# Patient Record
Sex: Female | Born: 1935 | Race: White | Hispanic: No | Smoking: Never smoker
Health system: Southern US, Community
[De-identification: ages and names within clinical notes are randomized; demographics above are authoritative.]

## PROBLEM LIST (undated history)

## (undated) DIAGNOSIS — K219 Gastro-esophageal reflux disease without esophagitis: Secondary | ICD-10-CM

## (undated) DIAGNOSIS — H3530 Unspecified macular degeneration: Secondary | ICD-10-CM

## (undated) DIAGNOSIS — F0390 Unspecified dementia without behavioral disturbance: Secondary | ICD-10-CM

## (undated) DIAGNOSIS — I350 Nonrheumatic aortic (valve) stenosis: Secondary | ICD-10-CM

## (undated) DIAGNOSIS — E039 Hypothyroidism, unspecified: Secondary | ICD-10-CM

## (undated) HISTORY — PX: ABDOMINAL HYSTERECTOMY: SHX81

## (undated) HISTORY — PX: CHOLECYSTECTOMY: SHX55

## (undated) HISTORY — PX: EYE SURGERY: SHX253

---

## 2014-07-11 DIAGNOSIS — R350 Frequency of micturition: Secondary | ICD-10-CM | POA: Diagnosis not present

## 2014-08-23 DIAGNOSIS — M25512 Pain in left shoulder: Secondary | ICD-10-CM | POA: Diagnosis not present

## 2014-08-23 DIAGNOSIS — R21 Rash and other nonspecific skin eruption: Secondary | ICD-10-CM | POA: Diagnosis not present

## 2014-10-12 DIAGNOSIS — H43813 Vitreous degeneration, bilateral: Secondary | ICD-10-CM | POA: Diagnosis not present

## 2014-10-12 DIAGNOSIS — H35051 Retinal neovascularization, unspecified, right eye: Secondary | ICD-10-CM | POA: Diagnosis not present

## 2014-10-12 DIAGNOSIS — H3532 Exudative age-related macular degeneration: Secondary | ICD-10-CM | POA: Diagnosis not present

## 2014-10-22 DIAGNOSIS — E039 Hypothyroidism, unspecified: Secondary | ICD-10-CM | POA: Diagnosis not present

## 2014-10-22 DIAGNOSIS — L989 Disorder of the skin and subcutaneous tissue, unspecified: Secondary | ICD-10-CM | POA: Diagnosis not present

## 2014-10-22 DIAGNOSIS — Z0000 Encounter for general adult medical examination without abnormal findings: Secondary | ICD-10-CM | POA: Diagnosis not present

## 2014-10-22 DIAGNOSIS — Z1389 Encounter for screening for other disorder: Secondary | ICD-10-CM | POA: Diagnosis not present

## 2014-10-22 DIAGNOSIS — F5101 Primary insomnia: Secondary | ICD-10-CM | POA: Diagnosis not present

## 2014-10-22 DIAGNOSIS — I10 Essential (primary) hypertension: Secondary | ICD-10-CM | POA: Diagnosis not present

## 2014-10-22 DIAGNOSIS — H5712 Ocular pain, left eye: Secondary | ICD-10-CM | POA: Diagnosis not present

## 2014-10-22 DIAGNOSIS — N183 Chronic kidney disease, stage 3 (moderate): Secondary | ICD-10-CM | POA: Diagnosis not present

## 2014-10-25 DIAGNOSIS — R4182 Altered mental status, unspecified: Secondary | ICD-10-CM | POA: Diagnosis not present

## 2014-11-02 DIAGNOSIS — X32XXXA Exposure to sunlight, initial encounter: Secondary | ICD-10-CM | POA: Diagnosis not present

## 2014-11-02 DIAGNOSIS — L309 Dermatitis, unspecified: Secondary | ICD-10-CM | POA: Diagnosis not present

## 2014-11-02 DIAGNOSIS — L570 Actinic keratosis: Secondary | ICD-10-CM | POA: Diagnosis not present

## 2014-11-06 DIAGNOSIS — H01023 Squamous blepharitis right eye, unspecified eyelid: Secondary | ICD-10-CM | POA: Diagnosis not present

## 2014-11-06 DIAGNOSIS — H5712 Ocular pain, left eye: Secondary | ICD-10-CM | POA: Diagnosis not present

## 2015-01-01 DIAGNOSIS — H52223 Regular astigmatism, bilateral: Secondary | ICD-10-CM | POA: Diagnosis not present

## 2015-01-01 DIAGNOSIS — H01001 Unspecified blepharitis right upper eyelid: Secondary | ICD-10-CM | POA: Diagnosis not present

## 2015-01-01 DIAGNOSIS — H5712 Ocular pain, left eye: Secondary | ICD-10-CM | POA: Diagnosis not present

## 2015-01-01 DIAGNOSIS — H5203 Hypermetropia, bilateral: Secondary | ICD-10-CM | POA: Diagnosis not present

## 2015-01-01 DIAGNOSIS — H01004 Unspecified blepharitis left upper eyelid: Secondary | ICD-10-CM | POA: Diagnosis not present

## 2015-01-01 DIAGNOSIS — H01005 Unspecified blepharitis left lower eyelid: Secondary | ICD-10-CM | POA: Diagnosis not present

## 2015-01-01 DIAGNOSIS — H01002 Unspecified blepharitis right lower eyelid: Secondary | ICD-10-CM | POA: Diagnosis not present

## 2015-01-01 DIAGNOSIS — H3532 Exudative age-related macular degeneration: Secondary | ICD-10-CM | POA: Diagnosis not present

## 2015-03-02 DIAGNOSIS — R509 Fever, unspecified: Secondary | ICD-10-CM | POA: Diagnosis not present

## 2015-03-02 DIAGNOSIS — R1084 Generalized abdominal pain: Secondary | ICD-10-CM | POA: Diagnosis not present

## 2015-03-11 ENCOUNTER — Ambulatory Visit
Admission: RE | Admit: 2015-03-11 | Discharge: 2015-03-11 | Disposition: A | Discharge status: Home / Self Care | Payer: Commercial Managed Care - HMO | Source: Ambulatory Visit | Attending: Internal Medicine | Admitting: Internal Medicine

## 2015-03-11 ENCOUNTER — Other Ambulatory Visit: Payer: Self-pay | Admitting: Internal Medicine

## 2015-03-11 DIAGNOSIS — R05 Cough: Secondary | ICD-10-CM

## 2015-03-11 DIAGNOSIS — R059 Cough, unspecified: Secondary | ICD-10-CM

## 2015-03-11 DIAGNOSIS — Z87440 Personal history of urinary (tract) infections: Secondary | ICD-10-CM | POA: Diagnosis not present

## 2015-03-11 DIAGNOSIS — G4700 Insomnia, unspecified: Secondary | ICD-10-CM | POA: Diagnosis not present

## 2015-04-23 DIAGNOSIS — N183 Chronic kidney disease, stage 3 (moderate): Secondary | ICD-10-CM | POA: Diagnosis not present

## 2015-04-23 DIAGNOSIS — I10 Essential (primary) hypertension: Secondary | ICD-10-CM | POA: Diagnosis not present

## 2015-04-23 DIAGNOSIS — E039 Hypothyroidism, unspecified: Secondary | ICD-10-CM | POA: Diagnosis not present

## 2015-04-23 DIAGNOSIS — G4700 Insomnia, unspecified: Secondary | ICD-10-CM | POA: Diagnosis not present

## 2015-04-23 DIAGNOSIS — Z23 Encounter for immunization: Secondary | ICD-10-CM | POA: Diagnosis not present

## 2015-04-29 DIAGNOSIS — H353124 Nonexudative age-related macular degeneration, left eye, advanced atrophic with subfoveal involvement: Secondary | ICD-10-CM | POA: Diagnosis not present

## 2015-04-29 DIAGNOSIS — H43813 Vitreous degeneration, bilateral: Secondary | ICD-10-CM | POA: Diagnosis not present

## 2015-04-29 DIAGNOSIS — H353212 Exudative age-related macular degeneration, right eye, with inactive choroidal neovascularization: Secondary | ICD-10-CM | POA: Diagnosis not present

## 2015-07-10 DIAGNOSIS — N390 Urinary tract infection, site not specified: Secondary | ICD-10-CM | POA: Diagnosis not present

## 2015-07-16 DIAGNOSIS — N390 Urinary tract infection, site not specified: Secondary | ICD-10-CM | POA: Diagnosis not present

## 2015-07-16 DIAGNOSIS — R4182 Altered mental status, unspecified: Secondary | ICD-10-CM | POA: Diagnosis not present

## 2015-07-16 DIAGNOSIS — R413 Other amnesia: Secondary | ICD-10-CM | POA: Diagnosis not present

## 2015-07-24 ENCOUNTER — Encounter (HOSPITAL_COMMUNITY): Payer: Self-pay | Admitting: Emergency Medicine

## 2015-07-24 ENCOUNTER — Emergency Department (HOSPITAL_COMMUNITY)
Admission: EM | Admit: 2015-07-24 | Discharge: 2015-07-24 | Disposition: A | Discharge status: Home / Self Care | Payer: Commercial Managed Care - HMO | Attending: Emergency Medicine | Admitting: Emergency Medicine

## 2015-07-24 ENCOUNTER — Emergency Department (HOSPITAL_COMMUNITY): Payer: Commercial Managed Care - HMO

## 2015-07-24 DIAGNOSIS — Z87440 Personal history of urinary (tract) infections: Secondary | ICD-10-CM | POA: Diagnosis not present

## 2015-07-24 DIAGNOSIS — K449 Diaphragmatic hernia without obstruction or gangrene: Secondary | ICD-10-CM | POA: Diagnosis not present

## 2015-07-24 DIAGNOSIS — Z79899 Other long term (current) drug therapy: Secondary | ICD-10-CM | POA: Insufficient documentation

## 2015-07-24 DIAGNOSIS — R410 Disorientation, unspecified: Secondary | ICD-10-CM | POA: Diagnosis not present

## 2015-07-24 DIAGNOSIS — Z8669 Personal history of other diseases of the nervous system and sense organs: Secondary | ICD-10-CM | POA: Diagnosis not present

## 2015-07-24 DIAGNOSIS — R4182 Altered mental status, unspecified: Secondary | ICD-10-CM | POA: Diagnosis present

## 2015-07-24 HISTORY — DX: Unspecified macular degeneration: H35.30

## 2015-07-24 LAB — CBC
HCT: 38.8 % (ref 36.0–46.0)
Hemoglobin: 13.6 g/dL (ref 12.0–15.0)
MCH: 31 pg (ref 26.0–34.0)
MCHC: 35.1 g/dL (ref 30.0–36.0)
MCV: 88.4 fL (ref 78.0–100.0)
Platelets: 241 10*3/uL (ref 150–400)
RBC: 4.39 MIL/uL (ref 3.87–5.11)
RDW: 13 % (ref 11.5–15.5)
WBC: 7.9 10*3/uL (ref 4.0–10.5)

## 2015-07-24 LAB — COMPREHENSIVE METABOLIC PANEL
ALT: 13 U/L — ABNORMAL LOW (ref 14–54)
AST: 26 U/L (ref 15–41)
Albumin: 3.8 g/dL (ref 3.5–5.0)
Alkaline Phosphatase: 50 U/L (ref 38–126)
Anion gap: 12 (ref 5–15)
BUN: 9 mg/dL (ref 6–20)
CO2: 20 mmol/L — ABNORMAL LOW (ref 22–32)
Calcium: 9.9 mg/dL (ref 8.9–10.3)
Chloride: 99 mmol/L — ABNORMAL LOW (ref 101–111)
Creatinine, Ser: 1.13 mg/dL — ABNORMAL HIGH (ref 0.44–1.00)
GFR calc Af Amer: 47 mL/min — ABNORMAL LOW (ref >60)
GFR calc non Af Amer: 40 mL/min — ABNORMAL LOW (ref >60)
Glucose, Bld: 223 mg/dL — ABNORMAL HIGH (ref 65–99)
Potassium: 3.7 mmol/L (ref 3.5–5.1)
Sodium: 131 mmol/L — ABNORMAL LOW (ref 135–145)
Total Bilirubin: 1 mg/dL (ref 0.3–1.2)
Total Protein: 7.1 g/dL (ref 6.5–8.1)

## 2015-07-24 LAB — URINALYSIS, ROUTINE W REFLEX MICROSCOPIC
Bilirubin Urine: NEGATIVE
Glucose, UA: 100 mg/dL — AB
Ketones, ur: NEGATIVE mg/dL
Leukocytes, UA: NEGATIVE
Nitrite: NEGATIVE
Protein, ur: NEGATIVE mg/dL
Specific Gravity, Urine: 1.005 (ref 1.005–1.030)
pH: 7 (ref 5.0–8.0)

## 2015-07-24 LAB — URINE MICROSCOPIC-ADD ON
Bacteria, UA: NONE SEEN
WBC, UA: NONE SEEN WBC/hpf (ref 0–5)

## 2015-07-24 MED ORDER — IOHEXOL 300 MG/ML  SOLN
75.0000 mL | Freq: Once | INTRAMUSCULAR | Status: AC | PRN
  Administered 2015-07-24: 75 mL via INTRAVENOUS

## 2015-07-24 NOTE — ED Provider Notes (Signed)
CSN: [id number]     Arrival date & time 07/24/15  1242 History   First MD Initiated Contact with Patient 07/24/15 1638     Chief Complaint  Patient presents with  . Recurrent UTI  . Altered Mental Status     (Consider location/radiation/quality/duration/timing/severity/associated sxs/prior Treatment) HPI   82yF with change in mental status. Onset about a week ago but worse in last few days. Recently tx'd for UTI, most recently cipro which just stopped. Has been having hallucinations such as seeing elephants in yard.Somewhat paranoid. Sleeping more erratic. Has not voiced any specific complaints. No recent falls/trauma. No fever. Appetite ok. No recent med changes aside from abx.   Past Medical History  Diagnosis Date  . Macular degeneration bilateral   Past Surgical History  Procedure Laterality Date  . Cholecystectomy    . Eye surgery     No family history on file. Social History  Substance Use Topics  . Smoking status: Never Smoker   . Smokeless tobacco: None  . Alcohol Use: No   OB History    No data available     Review of Systems  All systems reviewed and negative, other than as noted in HPI.   Allergies  Review of patient's allergies indicates no known allergies.  Home Medications   Prior to Admission medications   Medication Sig Start Date End Date Taking? Authorizing Provider  ALPRAZolam Duanne Moron) 1 MG tablet Take 1 mg by mouth at bedtime as needed for anxiety.   Yes Historical Provider, MD  ciprofloxacin (CIPRO) 500 MG tablet Take 500 mg by mouth 2 (two) times daily. For 7 days   Yes Historical Provider, MD  levothyroxine (SYNTHROID, LEVOTHROID) 100 MCG tablet Take 100 mcg by mouth See admin instructions. Patient takes every day except Monday Wednesday   Yes Historical Provider, MD  loratadine (CLARITIN) 10 MG tablet Take 10 mg by mouth daily.   Yes Historical Provider, MD  Melatonin 3 MG TABS Take 3 mg by mouth at bedtime.   Yes Historical Provider, MD   ranitidine (ZANTAC) 150 MG tablet Take 150 mg by mouth daily.   Yes Historical Provider, MD  traMADol (ULTRAM) 50 MG tablet Take 50 mg by mouth every 6 (six) hours as needed for moderate pain.   Yes Historical Provider, MD  Vitamin D, Cholecalciferol, 1000 units CAPS Take 1,000 mg by mouth daily.   Yes Historical Provider, MD   BP 182/80 mmHg  Pulse 74  Temp(Src) 97.8 F (36.6 C) (Oral)  Resp 23  Ht 5\' 1"  (1.549 m)  Wt 123 lb (55.792 kg)  BMI 23.25 kg/m2  SpO2 100% Physical Exam  Constitutional: She appears well-developed and well-nourished. No distress.  HENT:  Head: Normocephalic and atraumatic.  Eyes: Conjunctivae are normal. Right eye exhibits no discharge. Left eye exhibits no discharge.  Neck: Neck supple.  Cardiovascular: Normal rate, regular rhythm and normal heart sounds.  Exam reveals no gallop and no friction rub.   No murmur heard. Pulmonary/Chest: Effort normal and breath sounds normal. No respiratory distress.  Abdominal: Soft. She exhibits no distension. There is no tenderness.  Musculoskeletal: She exhibits no edema or tenderness.  Neurological: She is alert. No cranial nerve deficit. She exhibits normal muscle tone. Coordination normal.  Skin: Skin is warm and dry.  Psychiatric:  Pleasantly demented.   Nursing note and vitals reviewed.   ED Course  Procedures (including critical care time) Labs Review Labs Reviewed  COMPREHENSIVE METABOLIC PANEL - Abnormal; Notable for the following:  Sodium 131 (*)    Chloride 99 (*)    CO2 20 (*)    Glucose, Bld 223 (*)    Creatinine, Ser 1.13 (*)    ALT 13 (*)    GFR calc non Af Amer 40 (*)    GFR calc Af Amer 47 (*)    All other components within normal limits  URINALYSIS, ROUTINE W REFLEX MICROSCOPIC (NOT AT [facility]) - Abnormal; Notable for the following:    Glucose, UA 100 (*)    Hgb urine dipstick TRACE (*)    All other components within normal limits  URINE MICROSCOPIC-ADD ON - Abnormal; Notable for the  following:    Squamous Epithelial / LPF 0-5 (*)    All other components within normal limits  CBC    Imaging Review Ct Abdomen Pelvis W Contrast  07/24/2015  CLINICAL DATA:  Lower abdominal pain. Recurrent urinary tract infections. EXAM: CT ABDOMEN AND PELVIS WITH CONTRAST TECHNIQUE: Multidetector CT imaging of the abdomen and pelvis was performed using the standard protocol following bolus administration of intravenous contrast. CONTRAST:  36mL OMNIPAQUE IOHEXOL 300 MG/ML  SOLN COMPARISON:  None. FINDINGS: Lower chest: Small hiatal hernia is seen. There is also mild wall thickening of the distal thoracic esophagus, suspicious for esophagitis, although esophageal carcinoma cannot definitely be excluded. Hepatobiliary: No liver masses are identified. Mild diffuse biliary ductal dilatation is seen which may be related to prior cholecystectomy. No obstructing etiology visualized by CT. Pancreas: No mass, inflammatory changes, or other significant abnormality. Spleen: Within normal limits in size and appearance. Adrenals/Urinary Tract: No masses identified. Small right renal cyst noted. No evidence of hydronephrosis. Stomach/Bowel: No evidence of obstruction, inflammatory process, or abnormal fluid collections. Diverticulosis of the sigmoid colon is demonstrated, however there is no evidence of diverticulitis. Vascular/Lymphatic: No pathologically enlarged lymph nodes. No evidence of abdominal aortic aneurysm. Reproductive: Prior hysterectomy noted. Adnexal regions are unremarkable in appearance. Other: None. Musculoskeletal: No suspicious bone lesions identified. Advanced lumbar spine degenerative changes seen. Old T12 vertebral body compression fracture with previous vertebroplasty noted. IMPRESSION: Small hiatal hernia. Distal esophageal wall thickening is suspicious for esophagitis although esophageal carcinoma cannot definitely be excluded. Consider upper endoscopy for further evaluation. Diffuse biliary  ductal dilatation, likely secondary to prior cholecystectomy. Recommend correlation with liver function tests, and consider MRCP for further evaluation if clinically warranted. Colonic diverticulosis. No radiographic evidence of diverticulitis. Electronically Signed   By: Earle Gell M.D.   On: 07/24/2015 19:12   I have personally reviewed and evaluated these images and lab results as part of my medical decision-making.   EKG Interpretation None      MDM   Final diagnoses:  Delirium    82 year old female with delirium. Recently treated with antibiotics for UTI. Usually cephalexin and then subsequently changed to ciprofloxacin. Urinalysis today was not consistent with UTI. Quinolones can potentially cause change in mental status. She justed stopped this medication. If symptoms related to this, expect to improve. Discussion with family. Daughter is comfortable with DC. Return precautions discussed. Understands need for FU if symptoms dontinue.     Virgel Manifold, MD 07/31/15 [phone]

## 2015-07-24 NOTE — ED Notes (Signed)
Pt family member asking for a bed to put pt in due to leg swelling. Pt told no bed at this time, updated on the wait time. Offered to position her where he legs are propped on the bench or in another wheelchair. Daughter refused comfort measures. Daughter asking if she would have called the ambulance if she would have gotten back faster.

## 2015-07-24 NOTE — ED Notes (Signed)
Pt to ct via stretcher

## 2015-07-24 NOTE — ED Notes (Signed)
Pt from home with daughter for eval of recurrent UTI, pt has taken cipro with additional antibiotic but daughter reports confusion has gotten worse in the past 4 days. States pcp and urologist are both out of town. Pt denies any pain. Pt confused in triage.

## 2015-07-24 NOTE — Discharge Instructions (Signed)
Delirium Delirium is a state of mental confusion. It comes on quickly and causes significant changes in a person's thinking and behavior. People with delirium usually have trouble paying attention to what is going on or knowing where they are. They may become very withdrawn or very emotional and unable to sit still. They may even see or feel things that are not there (hallucinations). Delirium is a sign of a serious underlying medical condition. CAUSES Delirium occurs when something suddenly affects the signals that the brain sends out. Brain signals can be affected by anything that puts severe stress on the body and brain and causes brain chemicals to be out of balance. The most common causes of delirium include:  Infections. These may be bacterial, viral, fungal, or protozoal.  Medicines. These include many over-the-counter and prescription medicines.  Recreational drugs.  Substance withdrawal. This occurs with sudden discontinuation of alcohol, certain medicines, or recreational drugs.  Surgery.  Sudden vascular events, such as stroke, brain hemorrhage, and severe migraine.  Other brain disorders, such as tumors, seizures, and physical head trauma.  Metabolic disorders, such as kidney or liver failure.  Low blood oxygen (anoxia). This may occur with lung disease, cardiac arrest, or carbon monoxide poisoning.  Hormone imbalances (endocrinopathies), such as an overactive thyroid (hyperthyroidism) or underactive thyroid (hypothyroidism).  Vitamin deficiencies. RISK FACTORS This condition is more likely to develop in:  Children.  Older people.  People who live alone.  People who have vision loss or hearing loss.  People who have existing brain disease, such as dementia.  People who have long-lasting (chronic) medical conditions, such as heart disease.  People who are hospitalized for long periods of time. SYMPTOMS Delirium starts with a sudden change in a person's thinking  or behavior. Symptoms come and go (fluctuate) over time, and they are often worse at the end of the day. Symptoms include:  Not being able to stay awake (drowsiness) or pay attention.  Being confused about places, time, and people.  Forgetfulness.  Having extreme energy levels. These may be low or high.  Changes in sleep patterns.  Extreme mood swings, such as anger or anxiety.  Focusing on things or ideas that are not important.  Rambling and senseless talking.  Difficulty speaking, understanding speech, or both.  Hallucinations.  Tremor or unsteady gait. DIAGNOSIS People with delirium may not realize that they have the condition. Often, a family member or health care provider is the first person to notice the changes. The health care provider will obtain a detailed history of current symptoms, medical issues, medicines, and recreational drug use. The health care provider will perform a mental status examination by:  Asking questions to check for confusion.  Watching for abnormal behavior. The health care provider may perform a physical exam and order lab tests or additional studies to determine the cause of the delirium. TREATMENT Treatment of delirium depends on the cause and severity. Delirium usually goes away within days or weeks of treating the underlying cause. In the meantime, the person should not be left alone because he or she may accidentally cause self-harm. Treatment includes supportive care, such as:  Increased light during the day and decreased light at night.  Low noise level.  Uninterrupted sleep.  A regular daily schedule.  Clocks and calendars to help with orientation.  Familiar objects, including the person's pictures and clothing.  Frequent visits from familiar family and friends.  Healthy diet.  Exercise. In more severe cases of delirium, medicine may be prescribed  to help the person to keep calm and think more clearly. °HOME CARE  INSTRUCTIONS °· Any supportive care should be continued as told by the health care provider. °· All medicines should be used as told by the health care provider. This is important. °· The health care provider should be consulted before over-the-counter medicines, herbs, or supplements are used. °· All follow-up visits should be kept as told by the health care provider. This is important. °· Alcohol and recreational drugs should be avoided as told by the health care provider. °SEEK MEDICAL CARE IF: °· Symptoms do not get better or they become worse. °· New symptoms of delirium develop. °· Caring for the person at home does not seem safe. °· Eating, drinking, or communicating stops. °· There are side effects of medicines, such as changes in sleep patterns, dizziness, weight gain, restlessness, movement changes, or tremors. °SEEK IMMEDIATE MEDICAL CARE IF: °· Serious thoughts occur about self-harm or about hurting others. °· There are serious side effects of medicine, such as: °¨ Swelling of the face, lips, tongue, or throat. °¨ Fever, confusion, muscle spasms, or seizures. °  °This information is not intended to replace advice given to you by your health care provider. Make sure you discuss any questions you have with your health care provider. °  °Document Released: 03/09/2012 Document Revised: 10/30/2014 Document Reviewed: 08/08/2014 °Elsevier Interactive Patient Education ©2016 [employer] ° °

## 2015-09-03 ENCOUNTER — Emergency Department (HOSPITAL_COMMUNITY)
Admission: EM | Admit: 2015-09-03 | Discharge: 2015-09-04 | Disposition: A | Discharge status: Home / Self Care | Payer: Commercial Managed Care - HMO | Attending: Emergency Medicine | Admitting: Emergency Medicine

## 2015-09-03 DIAGNOSIS — Z79899 Other long term (current) drug therapy: Secondary | ICD-10-CM | POA: Diagnosis not present

## 2015-09-03 DIAGNOSIS — Y9389 Activity, other specified: Secondary | ICD-10-CM | POA: Insufficient documentation

## 2015-09-03 DIAGNOSIS — Y9289 Other specified places as the place of occurrence of the external cause: Secondary | ICD-10-CM | POA: Diagnosis not present

## 2015-09-03 DIAGNOSIS — W01198A Fall on same level from slipping, tripping and stumbling with subsequent striking against other object, initial encounter: Secondary | ICD-10-CM | POA: Diagnosis not present

## 2015-09-03 DIAGNOSIS — S0101XA Laceration without foreign body of scalp, initial encounter: Secondary | ICD-10-CM | POA: Diagnosis not present

## 2015-09-03 DIAGNOSIS — S0990XA Unspecified injury of head, initial encounter: Secondary | ICD-10-CM | POA: Diagnosis not present

## 2015-09-03 DIAGNOSIS — Y998 Other external cause status: Secondary | ICD-10-CM | POA: Insufficient documentation

## 2015-09-03 DIAGNOSIS — S199XXA Unspecified injury of neck, initial encounter: Secondary | ICD-10-CM | POA: Diagnosis not present

## 2015-09-03 DIAGNOSIS — Z8669 Personal history of other diseases of the nervous system and sense organs: Secondary | ICD-10-CM | POA: Insufficient documentation

## 2015-09-03 DIAGNOSIS — W19XXXA Unspecified fall, initial encounter: Secondary | ICD-10-CM

## 2015-09-04 ENCOUNTER — Emergency Department (HOSPITAL_COMMUNITY): Payer: Commercial Managed Care - HMO

## 2015-09-04 ENCOUNTER — Encounter (HOSPITAL_COMMUNITY): Payer: Self-pay

## 2015-09-04 DIAGNOSIS — S0990XA Unspecified injury of head, initial encounter: Secondary | ICD-10-CM | POA: Diagnosis not present

## 2015-09-04 DIAGNOSIS — S199XXA Unspecified injury of neck, initial encounter: Secondary | ICD-10-CM | POA: Diagnosis not present

## 2015-09-04 MED ORDER — LIDOCAINE HCL 2 % IJ SOLN
5.0000 mL | Freq: Once | INTRAMUSCULAR | Status: DC
  Administered 2015-09-04: 100 mg

## 2015-09-04 MED ORDER — LIDOCAINE-EPINEPHRINE-TETRACAINE (LET) SOLUTION: 3.0000 mL | Freq: Once | NASAL | Status: DC

## 2015-09-04 NOTE — ED Provider Notes (Signed)
CSN: [id number]     Arrival date & time 09/03/15  2352 History   First MD Initiated Contact with Patient 09/04/15 0145     Chief Complaint  Patient presents with  . Head Injury    (Consider location/radiation/quality/duration/timing/severity/associated sxs/prior Treatment) HPI Comments: 82 year old female with a history of macular degeneration presents to the emergency department for evaluation of a scalp laceration following a fall this evening. Patient lives with her daughter who states that the patient called out prior to her fall. Daughter arrived and found the patient between a chair and her portable commode. No reported LOC. No nausea or vomiting since the event. Patient is not on blood thinners. Daughter states the patient complained of some soreness in her neck. She has no complaints of headache or neck pain currently. Patient denies extremity numbness or weakness. No medications taken prior to arrival for symptoms.  Patient is a 82 y.o. female presenting with head injury. The history is provided by the patient and a relative. No language interpreter was used.  Head Injury Associated symptoms: no headaches, no neck pain and no vomiting     Past Medical History  Diagnosis Date  . Macular degeneration bilateral   Past Surgical History  Procedure Laterality Date  . Cholecystectomy    . Eye surgery     History reviewed. No pertinent family history. Social History  Substance Use Topics  . Smoking status: Never Smoker   . Smokeless tobacco: None  . Alcohol Use: No   OB History    No data available      Review of Systems  Gastrointestinal: Negative for vomiting.  Musculoskeletal: Negative for neck pain.  Skin: Positive for wound.  Neurological: Negative for syncope and headaches.  All other systems reviewed and are negative.   Allergies  Review of patient's allergies indicates no known allergies.  Home Medications   Prior to Admission medications   Medication Sig  Start Date End Date Taking? Authorizing Provider  ALPRAZolam Duanne Moron) 1 MG tablet Take 1 mg by mouth at bedtime as needed for anxiety.   Yes Historical Provider, MD  levothyroxine (SYNTHROID, LEVOTHROID) 88 MCG tablet Take 88 mcg by mouth daily. 06/04/15  Yes Historical Provider, MD  loratadine (CLARITIN) 10 MG tablet Take 10 mg by mouth daily.   Yes Historical Provider, MD  Melatonin 3 MG TABS Take 3 mg by mouth at bedtime.   Yes Historical Provider, MD  ranitidine (ZANTAC) 150 MG tablet Take 150 mg by mouth daily.   Yes Historical Provider, MD  traMADol (ULTRAM) 50 MG tablet Take 50-100 mg by mouth every 6 (six) hours as needed for moderate pain.    Yes Historical Provider, MD  Vitamin D, Cholecalciferol, 1000 units CAPS Take 1,000 Units by mouth daily.    Yes Historical Provider, MD   BP 149/63 mmHg  Pulse 56  Temp(Src) 98 F (36.7 C) (Oral)  Resp 16  SpO2 96%   Physical Exam  Constitutional: She is oriented to person, place, and time. She appears well-developed and well-nourished. No distress.  Alert, well appearing  HENT:  Head: Normocephalic. Head is with laceration.    Mouth/Throat: Oropharynx is clear and moist. No oropharyngeal exudate.  4cm laceration to posterior R parietal scalp. Bleeding controlled. No skull instability. No battle's sign or raccoon's eyes.  Eyes: Conjunctivae and EOM are normal. Pupils are equal, round, and reactive to light. No scleral icterus.  PERRL  Neck: Normal range of motion.  Cardiovascular: Normal rate, regular rhythm and  intact distal pulses.   Pulmonary/Chest: Effort normal and breath sounds normal. No respiratory distress. She has no wheezes. She has no rales.  Musculoskeletal: Normal range of motion.  Neurological: She is alert and oriented to person, place, and time. No cranial nerve deficit. She exhibits normal muscle tone. Coordination normal.  GCS 15. Speech is goal oriented. Patient with no focal neurologic deficits.  Skin: Skin is warm  and dry. No rash noted. She is not diaphoretic. No erythema. No pallor.  Psychiatric: She has a normal mood and affect. Her behavior is normal.  Nursing note and vitals reviewed.   ED Course  Procedures (including critical care time) Labs Review Labs Reviewed - No data to display  Imaging Review Ct Head Wo Contrast  09/04/2015  CLINICAL DATA:  Initial evaluation for acute trauma, fall, laceration. EXAM: CT HEAD WITHOUT CONTRAST CT CERVICAL SPINE WITHOUT CONTRAST TECHNIQUE: Multidetector CT imaging of the head and cervical spine was performed following the standard protocol without intravenous contrast. Multiplanar CT image reconstructions of the cervical spine were also generated. COMPARISON:  None. FINDINGS: CT HEAD FINDINGS Small right parietal scalp contusion/laceration. Scalp soft tissues otherwise unremarkable. No acute abnormality about the globes an orbits. Paranasal sinuses are clear.  No mastoid effusion. Calvarium intact. No acute intracranial hemorrhage. No acute large vessel territory infarct. Prominent vascular calcifications within the carotid siphons. No mass lesion, midline shift, or mass effect. No hydrocephalus. No extra-axial fluid collection. Prominent cerebral atrophy with moderate chronic small vessel ischemic disease. CT CERVICAL SPINE FINDINGS The reversal of the normal cervical lordosis with apex at C5. Trace anterior listhesis of C3 and C4. Trace retrolisthesis of C5 on C6 and C6 on C7. Vertebral body heights maintained. No acute fracture or malalignment. Normal C1-2 articulations are preserved. Dens is intact. Moderate multilevel degenerative spondylolysis as evidenced by intervertebral disc space narrowing, endplate sclerosis, and osteophytosis, most prevalent at C4-5, C5-6, C6-7. Probable degenerative changes about the C1-2 articulation. Prevertebral soft tissues are normal. No acute soft tissue abnormality within the neck. Prominent vascular calcifications about the carotid  bifurcations. Visualized lung apices are clear. IMPRESSION: CT BRAIN: 1. No acute intracranial process. 2. Small right parietal scalp contusion/laceration. 3. Advanced age-related cerebral atrophy with chronic small vessel ischemic disease. CT CERVICAL SPINE: No acute traumatic injury within the cervical spine. Electronically Signed   By: Jeannine Boga M.D.   On: 09/04/2015 02:43   Ct Cervical Spine Wo Contrast  09/04/2015  CLINICAL DATA:  Initial evaluation for acute trauma, fall, laceration. EXAM: CT HEAD WITHOUT CONTRAST CT CERVICAL SPINE WITHOUT CONTRAST TECHNIQUE: Multidetector CT imaging of the head and cervical spine was performed following the standard protocol without intravenous contrast. Multiplanar CT image reconstructions of the cervical spine were also generated. COMPARISON:  None. FINDINGS: CT HEAD FINDINGS Small right parietal scalp contusion/laceration. Scalp soft tissues otherwise unremarkable. No acute abnormality about the globes an orbits. Paranasal sinuses are clear.  No mastoid effusion. Calvarium intact. No acute intracranial hemorrhage. No acute large vessel territory infarct. Prominent vascular calcifications within the carotid siphons. No mass lesion, midline shift, or mass effect. No hydrocephalus. No extra-axial fluid collection. Prominent cerebral atrophy with moderate chronic small vessel ischemic disease. CT CERVICAL SPINE FINDINGS The reversal of the normal cervical lordosis with apex at C5. Trace anterior listhesis of C3 and C4. Trace retrolisthesis of C5 on C6 and C6 on C7. Vertebral body heights maintained. No acute fracture or malalignment. Normal C1-2 articulations are preserved. Dens is intact. Moderate multilevel degenerative spondylolysis  as evidenced by intervertebral disc space narrowing, endplate sclerosis, and osteophytosis, most prevalent at C4-5, C5-6, C6-7. Probable degenerative changes about the C1-2 articulation. Prevertebral soft tissues are normal. No  acute soft tissue abnormality within the neck. Prominent vascular calcifications about the carotid bifurcations. Visualized lung apices are clear. IMPRESSION: CT BRAIN: 1. No acute intracranial process. 2. Small right parietal scalp contusion/laceration. 3. Advanced age-related cerebral atrophy with chronic small vessel ischemic disease. CT CERVICAL SPINE: No acute traumatic injury within the cervical spine. Electronically Signed   By: Jeannine Boga M.D.   On: 09/04/2015 02:43     I have personally reviewed and evaluated these images and lab results as part of my medical decision-making.   EKG Interpretation None        MDM   Final diagnoses:  Scalp laceration, initial encounter  Fall, initial encounter    Patient with scalp laceration secondary to fall. Laceration occurred < 8 hours prior to repair which was well tolerated. Pt has no comorbidities to effect normal wound healing. Discussed suture home care with pt and daughter and answered questions. Pt to follow up for wound check and staple removal in 7 days with her PCP. Pt is hemodynamically stable w ithno complaints prior to discharge.     Filed Vitals:   09/04/15 0002 09/04/15 0257  BP: 173/72 149/63  Pulse: 68 56  Temp: 98 F (36.7 C)   TempSrc: Oral   Resp: 13 16  SpO2: 96% 96%     Antonietta Breach, PA-C 09/04/15 0310  Orpah Greek, MD 09/04/15 [phone]

## 2015-09-04 NOTE — Discharge Instructions (Signed)
Laceration Care, Adult A laceration is a cut that goes through all layers of the skin. The cut also goes into the tissue that is right under the skin. Some cuts heal on their own. Others need to be closed with stitches (sutures), staples, skin adhesive strips, or wound glue. Taking care of your cut lowers your risk of infection and helps your cut to heal better. HOW TO TAKE CARE OF YOUR CUT For stitches or staples:  Keep the wound clean and dry.  If you were given a bandage (dressing), you should change it at least one time per day or as told by your doctor. You should also change it if it gets wet or dirty.  Keep the wound completely dry for the first 24 hours or as told by your doctor. After that time, you may take a shower or a bath. However, make sure that the wound is not soaked in water until after the stitches or staples have been removed.  Clean the wound one time each day or as told by your doctor:  Wash the wound with soap and water.  Rinse the wound with water until all of the soap comes off.  Pat the wound dry with a clean towel. Do not rub the wound.  After you clean the wound, put a thin layer of antibiotic ointment on it as told by your doctor. This ointment:  Helps to prevent infection.  Keeps the bandage from sticking to the wound.  Have your stitches or staples removed as told by your doctor. If your doctor used skin adhesive strips:   Keep the wound clean and dry.  If you were given a bandage, you should change it at least one time per day or as told by your doctor. You should also change it if it gets dirty or wet.  Do not get the skin adhesive strips wet. You can take a shower or a bath, but be careful to keep the wound dry.  If the wound gets wet, pat it dry with a clean towel. Do not rub the wound.  Skin adhesive strips fall off on their own. You can trim the strips as the wound heals. Do not remove any strips that are still stuck to the wound. They will  fall off after a while. If your doctor used wound glue:  Try to keep your wound dry, but you may briefly wet it in the shower or bath. Do not soak the wound in water, such as by swimming.  After you take a shower or a bath, gently pat the wound dry with a clean towel. Do not rub the wound.  Do not do any activities that will make you really sweaty until the skin glue has fallen off on its own.  Do not apply liquid, cream, or ointment medicine to your wound while the skin glue is still on.  If you were given a bandage, you should change it at least one time per day or as told by your doctor. You should also change it if it gets dirty or wet.  If a bandage is placed over the wound, do not let the tape for the bandage touch the skin glue.  Do not pick at the glue. The skin glue usually stays on for 5-10 days. Then, it falls off of the skin. General Instructions  To help prevent scarring, make sure to cover your wound with sunscreen whenever you are outside after stitches are removed, after adhesive strips are removed,  or when wound glue stays in place and the wound is healed. Make sure to wear a sunscreen of at least 30 SPF.  Take over-the-counter and prescription medicines only as told by your doctor.  If you were given antibiotic medicine or ointment, take or apply it as told by your doctor. Do not stop using the antibiotic even if your wound is getting better.  Do not scratch or pick at the wound.  Keep all follow-up visits as told by your doctor. This is important.  Check your wound every day for signs of infection. Watch for:  Redness, swelling, or pain.  Fluid, blood, or pus.  Raise (elevate) the injured area above the level of your heart while you are sitting or lying down, if possible. GET HELP IF:  You got a tetanus shot and you have any of these problems at the injection site:  Swelling.  Very bad pain.  Redness.  Bleeding.  You have a fever.  A wound that was  closed breaks open.  You notice a bad smell coming from your wound or your bandage.  You notice something coming out of the wound, such as wood or glass.  Medicine does not help your pain.  You have more redness, swelling, or pain at the site of your wound.  You have fluid, blood, or pus coming from your wound.  You notice a change in the color of your skin near your wound.  You need to change the bandage often because fluid, blood, or pus is coming from the wound.  You start to have a new rash.  You start to have numbness around the wound. GET HELP RIGHT AWAY IF:  You have very bad swelling around the wound.  Your pain suddenly gets worse and is very bad.  You notice painful lumps near the wound or on skin that is anywhere on your body.  You have a red streak going away from your wound.  The wound is on your hand or foot and you cannot move a finger or toe like you usually can.  The wound is on your hand or foot and you notice that your fingers or toes look pale or bluish.   This information is not intended to replace advice given to you by your health care provider. Make sure you discuss any questions you have with your health care provider.   Document Released: 12/02/2007 Document Revised: 10/30/2014 Document Reviewed: 06/11/2014 Elsevier Interactive Patient Education 2016 [place] in the [place]  Falls can cause injuries and can affect people from all age groups. There are many simple things that you can do to make your home safe and to help prevent falls. WHAT CAN I DO ON THE OUTSIDE OF MY HOME?  Regularly repair the edges of walkways and driveways and fix any cracks.  Remove high doorway thresholds.  Trim any shrubbery on the main path into your home.  Use bright outdoor lighting.  Clear walkways of debris and clutter, including tools and rocks.  Regularly check that handrails are securely fastened and in good repair. Both sides of any steps  should have handrails.  Install guardrails along the edges of any raised decks or porches.  Have leaves, snow, and ice cleared regularly.  Use sand or salt on walkways during winter months.  In the garage, clean up any spills right away, including grease or oil spills. WHAT CAN I DO IN THE BATHROOM?  Use night lights.  Install grab bars by  the toilet and in the tub and shower. Do not use towel bars as grab bars.  Use non-skid mats or decals on the floor of the tub or shower.  If you need to sit down while you are in the shower, use a plastic, non-slip stool.[employer]  Keep the floor dry. Immediately clean up any water that spills on the floor.  Remove soap buildup in the tub or shower on a regular basis.  Attach bath mats securely with double-sided non-slip rug tape.  Remove throw rugs and other tripping hazards from the floor. WHAT CAN I DO IN THE BEDROOM?  Use night lights.  Make sure that a bedside light is easy to reach.  Do not use oversized bedding that drapes onto the floor.  Have a firm chair that has side arms to use for getting dressed.  Remove throw rugs and other tripping hazards from the floor. WHAT CAN I DO IN THE KITCHEN?   Clean up any spills right away.  Avoid walking on wet floors.  Place frequently used items in easy-to-reach places.  If you need to reach for something above you, use a sturdy step stool that has a grab bar.  Keep electrical cables out of the way.  Do not use floor polish or wax that makes floors slippery. If you have to use wax, make sure that it is non-skid floor wax.  Remove throw rugs and other tripping hazards from the floor. WHAT CAN I DO IN THE STAIRWAYS?  Do not leave any items on the stairs.  Make sure that there are handrails on both sides of the stairs. Fix handrails that are broken or loose. Make sure that handrails are as long as the stairways.  Check any carpeting to make sure that it is firmly attached to the stairs.  Fix any carpet that is loose or worn.  Avoid having throw rugs at the top or bottom of stairways, or secure the rugs with carpet tape to prevent them from moving.  Make sure that you have a light switch at the top of the stairs and the bottom of the stairs. If you do not have them, have them installed. WHAT ARE SOME OTHER FALL PREVENTION TIPS?  Wear closed-toe shoes that fit well and support your feet. Wear shoes that have rubber soles or low heels.  When you use a stepladder, make sure that it is completely opened and that the sides are firmly locked. Have someone hold the ladder while you are using it. Do not climb a closed stepladder.  Add color or contrast paint or tape to grab bars and handrails in your home. Place contrasting color strips on the first and last steps.  Use mobility aids as needed, such as canes, walkers, scooters, and crutches.  Turn on lights if it is dark. Replace any light bulbs that burn out.  Set up furniture so that there are clear paths. Keep the furniture in the same spot.  Fix any uneven floor surfaces.  Choose a carpet design that does not hide the edge of steps of a stairway.  Be aware of any and all pets.  Review your medicines with your healthcare provider. Some medicines can cause dizziness or changes in blood pressure, which increase your risk of falling. Talk with your health care provider about other ways that you can decrease your risk of falls. This may include working with a physical therapist or trainer to improve your strength, balance, and endurance.   This information is  not intended to replace advice given to you by your health care provider. Make sure you discuss any questions you have with your health care provider.   Document Released: 06/05/2002 Document Revised: 10/30/2014 Document Reviewed: 07/20/2014 Elsevier Interactive Patient Education [employer].

## 2015-09-04 NOTE — ED Provider Notes (Addendum)
Patient presented to the ER with head injury. Patient had mechanical fall hitting the right posterior aspect of her scalp. Patient has a large laceration. No loss of consciousness.  Face to face Exam: HEENT - PERRLA Lungs - CTAB Heart - RRR, no M/R/G Abd - S/NT/ND Neuro - alert, oriented x3  Plan: CT head and cervical spine performed, no acute injury noted other than skin injury. Staples placed for skin closure.  LACERATION REPAIR Performed by: Orpah Greek. Authorized by: Orpah Greek Consent: Verbal consent obtained. Risks and benefits: risks, benefits and alternatives were discussed Consent given by: patient Patient identity confirmed: provided demographic data Prepped and Draped in normal sterile fashion Wound explored  Laceration Location: scalp  Laceration Length: 4cm  No Foreign Bodies seen or palpated  Anesthesia: local infiltration  Local anesthetic: lidocaine 2% w/o epinephrine  Anesthetic total: 1 ml  Irrigation method: syringe Amount of cleaning: standard  Skin closure: staple (#6)    Patient tolerance: Patient tolerated the procedure well with no immediate complications.   Orpah Greek, MD 09/04/15 [id number]  Orpah Greek, MD 09/04/15 [phone]

## 2015-09-04 NOTE — ED Notes (Signed)
Pt fell and hit head and has a head laceration, bleeding controlled

## 2015-09-13 DIAGNOSIS — S0101XD Laceration without foreign body of scalp, subsequent encounter: Secondary | ICD-10-CM | POA: Diagnosis not present

## 2015-09-13 DIAGNOSIS — Z4802 Encounter for removal of sutures: Secondary | ICD-10-CM | POA: Diagnosis not present

## 2015-10-23 DIAGNOSIS — N183 Chronic kidney disease, stage 3 (moderate): Secondary | ICD-10-CM | POA: Diagnosis not present

## 2015-10-23 DIAGNOSIS — I10 Essential (primary) hypertension: Secondary | ICD-10-CM | POA: Diagnosis not present

## 2015-10-23 DIAGNOSIS — H6123 Impacted cerumen, bilateral: Secondary | ICD-10-CM | POA: Diagnosis not present

## 2015-10-23 DIAGNOSIS — E039 Hypothyroidism, unspecified: Secondary | ICD-10-CM | POA: Diagnosis not present

## 2015-10-24 DIAGNOSIS — H353124 Nonexudative age-related macular degeneration, left eye, advanced atrophic with subfoveal involvement: Secondary | ICD-10-CM | POA: Diagnosis not present

## 2015-10-24 DIAGNOSIS — H353212 Exudative age-related macular degeneration, right eye, with inactive choroidal neovascularization: Secondary | ICD-10-CM | POA: Diagnosis not present

## 2015-11-21 DIAGNOSIS — N3000 Acute cystitis without hematuria: Secondary | ICD-10-CM | POA: Diagnosis not present

## 2015-11-21 DIAGNOSIS — R410 Disorientation, unspecified: Secondary | ICD-10-CM | POA: Diagnosis not present

## 2015-12-27 DIAGNOSIS — R350 Frequency of micturition: Secondary | ICD-10-CM | POA: Diagnosis not present

## 2015-12-27 DIAGNOSIS — N3020 Other chronic cystitis without hematuria: Secondary | ICD-10-CM | POA: Diagnosis not present

## 2016-03-16 DIAGNOSIS — R350 Frequency of micturition: Secondary | ICD-10-CM | POA: Diagnosis not present

## 2016-03-16 DIAGNOSIS — N3020 Other chronic cystitis without hematuria: Secondary | ICD-10-CM | POA: Diagnosis not present

## 2016-03-30 DIAGNOSIS — I10 Essential (primary) hypertension: Secondary | ICD-10-CM | POA: Diagnosis not present

## 2016-03-30 DIAGNOSIS — M1990 Unspecified osteoarthritis, unspecified site: Secondary | ICD-10-CM | POA: Diagnosis not present

## 2016-03-30 DIAGNOSIS — R413 Other amnesia: Secondary | ICD-10-CM | POA: Diagnosis not present

## 2016-03-30 DIAGNOSIS — Z23 Encounter for immunization: Secondary | ICD-10-CM | POA: Diagnosis not present

## 2016-03-30 DIAGNOSIS — E039 Hypothyroidism, unspecified: Secondary | ICD-10-CM | POA: Diagnosis not present

## 2016-03-30 DIAGNOSIS — N183 Chronic kidney disease, stage 3 (moderate): Secondary | ICD-10-CM | POA: Diagnosis not present

## 2016-04-03 DIAGNOSIS — H52223 Regular astigmatism, bilateral: Secondary | ICD-10-CM | POA: Diagnosis not present

## 2016-04-03 DIAGNOSIS — H01005 Unspecified blepharitis left lower eyelid: Secondary | ICD-10-CM | POA: Diagnosis not present

## 2016-04-03 DIAGNOSIS — H01001 Unspecified blepharitis right upper eyelid: Secondary | ICD-10-CM | POA: Diagnosis not present

## 2016-04-03 DIAGNOSIS — H5203 Hypermetropia, bilateral: Secondary | ICD-10-CM | POA: Diagnosis not present

## 2016-04-03 DIAGNOSIS — H01004 Unspecified blepharitis left upper eyelid: Secondary | ICD-10-CM | POA: Diagnosis not present

## 2016-04-03 DIAGNOSIS — H01002 Unspecified blepharitis right lower eyelid: Secondary | ICD-10-CM | POA: Diagnosis not present

## 2016-04-03 DIAGNOSIS — H524 Presbyopia: Secondary | ICD-10-CM | POA: Diagnosis not present

## 2016-04-22 DIAGNOSIS — H353212 Exudative age-related macular degeneration, right eye, with inactive choroidal neovascularization: Secondary | ICD-10-CM | POA: Diagnosis not present

## 2016-04-22 DIAGNOSIS — H43813 Vitreous degeneration, bilateral: Secondary | ICD-10-CM | POA: Diagnosis not present

## 2016-04-22 DIAGNOSIS — H31012 Macula scars of posterior pole (postinflammatory) (post-traumatic), left eye: Secondary | ICD-10-CM | POA: Diagnosis not present

## 2016-04-22 DIAGNOSIS — H353124 Nonexudative age-related macular degeneration, left eye, advanced atrophic with subfoveal involvement: Secondary | ICD-10-CM | POA: Diagnosis not present

## 2016-04-29 DIAGNOSIS — L821 Other seborrheic keratosis: Secondary | ICD-10-CM | POA: Diagnosis not present

## 2016-04-29 DIAGNOSIS — L308 Other specified dermatitis: Secondary | ICD-10-CM | POA: Diagnosis not present

## 2016-04-29 DIAGNOSIS — C44311 Basal cell carcinoma of skin of nose: Secondary | ICD-10-CM | POA: Diagnosis not present

## 2016-05-27 DIAGNOSIS — G8929 Other chronic pain: Secondary | ICD-10-CM | POA: Diagnosis not present

## 2016-05-27 DIAGNOSIS — R413 Other amnesia: Secondary | ICD-10-CM | POA: Diagnosis not present

## 2016-05-27 DIAGNOSIS — N183 Chronic kidney disease, stage 3 (moderate): Secondary | ICD-10-CM | POA: Diagnosis not present

## 2016-05-27 DIAGNOSIS — M1990 Unspecified osteoarthritis, unspecified site: Secondary | ICD-10-CM | POA: Diagnosis not present

## 2016-05-27 DIAGNOSIS — R197 Diarrhea, unspecified: Secondary | ICD-10-CM | POA: Diagnosis not present

## 2016-06-18 ENCOUNTER — Emergency Department (HOSPITAL_COMMUNITY): Payer: Commercial Managed Care - HMO

## 2016-06-18 ENCOUNTER — Encounter (HOSPITAL_COMMUNITY): Payer: Self-pay | Admitting: Radiology

## 2016-06-18 ENCOUNTER — Inpatient Hospital Stay (HOSPITAL_COMMUNITY)
Admission: EM | Admit: 2016-06-18 | Discharge: 2016-06-25 | DRG: 470 | Disposition: A | Discharge status: Skilled Nursing Facility | Payer: Commercial Managed Care - HMO | Attending: Internal Medicine | Admitting: Internal Medicine

## 2016-06-18 DIAGNOSIS — R627 Adult failure to thrive: Secondary | ICD-10-CM | POA: Diagnosis present

## 2016-06-18 DIAGNOSIS — D519 Vitamin B12 deficiency anemia, unspecified: Secondary | ICD-10-CM | POA: Diagnosis present

## 2016-06-18 DIAGNOSIS — I4892 Unspecified atrial flutter: Secondary | ICD-10-CM | POA: Diagnosis present

## 2016-06-18 DIAGNOSIS — E538 Deficiency of other specified B group vitamins: Secondary | ICD-10-CM | POA: Diagnosis present

## 2016-06-18 DIAGNOSIS — F0390 Unspecified dementia without behavioral disturbance: Secondary | ICD-10-CM | POA: Diagnosis not present

## 2016-06-18 DIAGNOSIS — E876 Hypokalemia: Secondary | ICD-10-CM | POA: Diagnosis present

## 2016-06-18 DIAGNOSIS — S199XXA Unspecified injury of neck, initial encounter: Secondary | ICD-10-CM | POA: Diagnosis not present

## 2016-06-18 DIAGNOSIS — E559 Vitamin D deficiency, unspecified: Secondary | ICD-10-CM | POA: Diagnosis present

## 2016-06-18 DIAGNOSIS — R296 Repeated falls: Secondary | ICD-10-CM | POA: Diagnosis present

## 2016-06-18 DIAGNOSIS — E039 Hypothyroidism, unspecified: Secondary | ICD-10-CM | POA: Diagnosis present

## 2016-06-18 DIAGNOSIS — D529 Folate deficiency anemia, unspecified: Secondary | ICD-10-CM | POA: Diagnosis not present

## 2016-06-18 DIAGNOSIS — Z471 Aftercare following joint replacement surgery: Secondary | ICD-10-CM | POA: Diagnosis not present

## 2016-06-18 DIAGNOSIS — S72042A Displaced fracture of base of neck of left femur, initial encounter for closed fracture: Secondary | ICD-10-CM | POA: Diagnosis not present

## 2016-06-18 DIAGNOSIS — B9620 Unspecified Escherichia coli [E. coli] as the cause of diseases classified elsewhere: Secondary | ICD-10-CM | POA: Diagnosis present

## 2016-06-18 DIAGNOSIS — S0990XA Unspecified injury of head, initial encounter: Secondary | ICD-10-CM | POA: Diagnosis not present

## 2016-06-18 DIAGNOSIS — E119 Type 2 diabetes mellitus without complications: Secondary | ICD-10-CM | POA: Diagnosis not present

## 2016-06-18 DIAGNOSIS — K219 Gastro-esophageal reflux disease without esophagitis: Secondary | ICD-10-CM | POA: Diagnosis present

## 2016-06-18 DIAGNOSIS — H3530 Unspecified macular degeneration: Secondary | ICD-10-CM | POA: Diagnosis present

## 2016-06-18 DIAGNOSIS — D509 Iron deficiency anemia, unspecified: Secondary | ICD-10-CM | POA: Diagnosis present

## 2016-06-18 DIAGNOSIS — E1165 Type 2 diabetes mellitus with hyperglycemia: Secondary | ICD-10-CM | POA: Diagnosis not present

## 2016-06-18 DIAGNOSIS — R778 Other specified abnormalities of plasma proteins: Secondary | ICD-10-CM | POA: Diagnosis present

## 2016-06-18 DIAGNOSIS — Z09 Encounter for follow-up examination after completed treatment for conditions other than malignant neoplasm: Secondary | ICD-10-CM

## 2016-06-18 DIAGNOSIS — I350 Nonrheumatic aortic (valve) stenosis: Secondary | ICD-10-CM | POA: Diagnosis not present

## 2016-06-18 DIAGNOSIS — S72009A Fracture of unspecified part of neck of unspecified femur, initial encounter for closed fracture: Secondary | ICD-10-CM | POA: Diagnosis not present

## 2016-06-18 DIAGNOSIS — S72002A Fracture of unspecified part of neck of left femur, initial encounter for closed fracture: Secondary | ICD-10-CM | POA: Diagnosis present

## 2016-06-18 DIAGNOSIS — I4891 Unspecified atrial fibrillation: Secondary | ICD-10-CM | POA: Diagnosis not present

## 2016-06-18 DIAGNOSIS — Z79899 Other long term (current) drug therapy: Secondary | ICD-10-CM

## 2016-06-18 DIAGNOSIS — Z66 Do not resuscitate: Secondary | ICD-10-CM | POA: Diagnosis present

## 2016-06-18 DIAGNOSIS — N390 Urinary tract infection, site not specified: Secondary | ICD-10-CM | POA: Diagnosis present

## 2016-06-18 DIAGNOSIS — S8992XA Unspecified injury of left lower leg, initial encounter: Secondary | ICD-10-CM | POA: Diagnosis not present

## 2016-06-18 DIAGNOSIS — Z794 Long term (current) use of insulin: Secondary | ICD-10-CM

## 2016-06-18 DIAGNOSIS — E038 Other specified hypothyroidism: Secondary | ICD-10-CM | POA: Diagnosis not present

## 2016-06-18 DIAGNOSIS — I248 Other forms of acute ischemic heart disease: Secondary | ICD-10-CM | POA: Diagnosis not present

## 2016-06-18 DIAGNOSIS — S72012A Unspecified intracapsular fracture of left femur, initial encounter for closed fracture: Principal | ICD-10-CM | POA: Diagnosis present

## 2016-06-18 DIAGNOSIS — D62 Acute posthemorrhagic anemia: Secondary | ICD-10-CM | POA: Diagnosis not present

## 2016-06-18 DIAGNOSIS — D6489 Other specified anemias: Secondary | ICD-10-CM | POA: Diagnosis not present

## 2016-06-18 DIAGNOSIS — M25552 Pain in left hip: Secondary | ICD-10-CM | POA: Diagnosis present

## 2016-06-18 DIAGNOSIS — I4510 Unspecified right bundle-branch block: Secondary | ICD-10-CM | POA: Diagnosis present

## 2016-06-18 DIAGNOSIS — E440 Moderate protein-calorie malnutrition: Secondary | ICD-10-CM | POA: Diagnosis not present

## 2016-06-18 DIAGNOSIS — D518 Other vitamin B12 deficiency anemias: Secondary | ICD-10-CM | POA: Diagnosis not present

## 2016-06-18 DIAGNOSIS — R259 Unspecified abnormal involuntary movements: Secondary | ICD-10-CM | POA: Diagnosis not present

## 2016-06-18 DIAGNOSIS — K5909 Other constipation: Secondary | ICD-10-CM | POA: Diagnosis not present

## 2016-06-18 DIAGNOSIS — N3000 Acute cystitis without hematuria: Secondary | ICD-10-CM

## 2016-06-18 DIAGNOSIS — R918 Other nonspecific abnormal finding of lung field: Secondary | ICD-10-CM | POA: Diagnosis not present

## 2016-06-18 DIAGNOSIS — R739 Hyperglycemia, unspecified: Secondary | ICD-10-CM | POA: Diagnosis present

## 2016-06-18 DIAGNOSIS — S72012D Unspecified intracapsular fracture of left femur, subsequent encounter for closed fracture with routine healing: Secondary | ICD-10-CM | POA: Diagnosis not present

## 2016-06-18 DIAGNOSIS — S098XXA Other specified injuries of head, initial encounter: Secondary | ICD-10-CM | POA: Diagnosis not present

## 2016-06-18 DIAGNOSIS — R748 Abnormal levels of other serum enzymes: Secondary | ICD-10-CM | POA: Diagnosis not present

## 2016-06-18 DIAGNOSIS — W19XXXA Unspecified fall, initial encounter: Secondary | ICD-10-CM

## 2016-06-18 DIAGNOSIS — Y92099 Unspecified place in other non-institutional residence as the place of occurrence of the external cause: Secondary | ICD-10-CM | POA: Diagnosis not present

## 2016-06-18 DIAGNOSIS — R7989 Other specified abnormal findings of blood chemistry: Secondary | ICD-10-CM | POA: Diagnosis present

## 2016-06-18 DIAGNOSIS — I480 Paroxysmal atrial fibrillation: Secondary | ICD-10-CM | POA: Diagnosis not present

## 2016-06-18 DIAGNOSIS — S72142A Displaced intertrochanteric fracture of left femur, initial encounter for closed fracture: Secondary | ICD-10-CM | POA: Diagnosis not present

## 2016-06-18 DIAGNOSIS — S0083XA Contusion of other part of head, initial encounter: Secondary | ICD-10-CM | POA: Diagnosis not present

## 2016-06-18 DIAGNOSIS — I959 Hypotension, unspecified: Secondary | ICD-10-CM | POA: Diagnosis present

## 2016-06-18 DIAGNOSIS — F05 Delirium due to known physiological condition: Secondary | ICD-10-CM | POA: Diagnosis not present

## 2016-06-18 DIAGNOSIS — Z452 Encounter for adjustment and management of vascular access device: Secondary | ICD-10-CM | POA: Diagnosis not present

## 2016-06-18 DIAGNOSIS — S72002D Fracture of unspecified part of neck of left femur, subsequent encounter for closed fracture with routine healing: Secondary | ICD-10-CM | POA: Diagnosis not present

## 2016-06-18 DIAGNOSIS — Z96642 Presence of left artificial hip joint: Secondary | ICD-10-CM | POA: Diagnosis not present

## 2016-06-18 DIAGNOSIS — I10 Essential (primary) hypertension: Secondary | ICD-10-CM | POA: Diagnosis present

## 2016-06-18 DIAGNOSIS — W1830XA Fall on same level, unspecified, initial encounter: Secondary | ICD-10-CM | POA: Diagnosis present

## 2016-06-18 DIAGNOSIS — Z419 Encounter for procedure for purposes other than remedying health state, unspecified: Secondary | ICD-10-CM

## 2016-06-18 DIAGNOSIS — M79662 Pain in left lower leg: Secondary | ICD-10-CM | POA: Diagnosis not present

## 2016-06-18 DIAGNOSIS — Y92009 Unspecified place in unspecified non-institutional (private) residence as the place of occurrence of the external cause: Secondary | ICD-10-CM

## 2016-06-18 HISTORY — DX: Nonrheumatic aortic (valve) stenosis: I35.0

## 2016-06-18 HISTORY — DX: Gastro-esophageal reflux disease without esophagitis: K21.9

## 2016-06-18 HISTORY — DX: Unspecified dementia, unspecified severity, without behavioral disturbance, psychotic disturbance, mood disturbance, and anxiety: F03.90

## 2016-06-18 HISTORY — DX: Hypothyroidism, unspecified: E03.9

## 2016-06-18 LAB — URINALYSIS, ROUTINE W REFLEX MICROSCOPIC
BILIRUBIN URINE: NEGATIVE
Glucose, UA: NEGATIVE mg/dL
KETONES UR: NEGATIVE mg/dL
Nitrite: POSITIVE — AB
PROTEIN: NEGATIVE mg/dL
SQUAMOUS EPITHELIAL / LPF: NONE SEEN
Specific Gravity, Urine: 1.013 (ref 1.005–1.030)
pH: 5 (ref 5.0–8.0)

## 2016-06-18 LAB — CBC WITH DIFFERENTIAL/PLATELET
Basophils Absolute: 0 10*3/uL (ref 0.0–0.1)
Basophils Relative: 0 %
EOS ABS: 0.2 10*3/uL (ref 0.0–0.7)
EOS PCT: 3 %
HCT: 45.5 % (ref 36.0–46.0)
Hemoglobin: 15.8 g/dL — ABNORMAL HIGH (ref 12.0–15.0)
LYMPHS ABS: 1.3 10*3/uL (ref 0.7–4.0)
Lymphocytes Relative: 17 %
MCH: 33 pg (ref 26.0–34.0)
MCHC: 34.7 g/dL (ref 30.0–36.0)
MCV: 95 fL (ref 78.0–100.0)
MONO ABS: 0.6 10*3/uL (ref 0.1–1.0)
Monocytes Relative: 8 %
Neutro Abs: 5.3 10*3/uL (ref 1.7–7.7)
Neutrophils Relative %: 72 %
PLATELETS: 172 10*3/uL (ref 150–400)
RBC: 4.79 MIL/uL (ref 3.87–5.11)
RDW: 13.3 % (ref 11.5–15.5)
WBC: 7.4 10*3/uL (ref 4.0–10.5)

## 2016-06-18 LAB — I-STAT CG4 LACTIC ACID, ED: Lactic Acid, Venous: 1.75 mmol/L (ref 0.5–1.9)

## 2016-06-18 LAB — BASIC METABOLIC PANEL WITH GFR
Anion gap: 13 (ref 5–15)
BUN: 14 mg/dL (ref 6–20)
CO2: 23 mmol/L (ref 22–32)
Calcium: 9.7 mg/dL (ref 8.9–10.3)
Chloride: 97 mmol/L — ABNORMAL LOW (ref 101–111)
Creatinine, Ser: 1.57 mg/dL — ABNORMAL HIGH (ref 0.44–1.00)
GFR calc Af Amer: 31 mL/min — ABNORMAL LOW
GFR calc non Af Amer: 27 mL/min — ABNORMAL LOW
Glucose, Bld: 219 mg/dL — ABNORMAL HIGH (ref 65–99)
Potassium: 4 mmol/L (ref 3.5–5.1)
Sodium: 133 mmol/L — ABNORMAL LOW (ref 135–145)

## 2016-06-18 LAB — TYPE AND SCREEN
ABO/RH(D): B NEG
ANTIBODY SCREEN: NEGATIVE

## 2016-06-18 LAB — MRSA PCR SCREENING: MRSA by PCR: NEGATIVE

## 2016-06-18 LAB — PROTIME-INR
INR: 0.98
PROTHROMBIN TIME: 13 s (ref 11.4–15.2)

## 2016-06-18 LAB — TROPONIN I
Troponin I: <0.03 ng/mL (ref <0.03)
Troponin I: 0.03 ng/mL (ref <0.03)

## 2016-06-18 LAB — TSH: TSH: 5.188 u[IU]/mL — ABNORMAL HIGH (ref 0.350–4.500)

## 2016-06-18 LAB — ABO/RH: ABO/RH(D): B NEG

## 2016-06-18 MED ORDER — ALPRAZOLAM 1 MG PO TABS
1.0000 mg | ORAL_TABLET | Freq: Every day | ORAL | Status: DC
  Administered 2016-06-20 – 2016-06-24 (×4): 1 mg via ORAL
  Filled 2016-06-18 (×5): qty 1

## 2016-06-18 MED ORDER — MORPHINE SULFATE (PF) 2 MG/ML IV SOLN
1.0000 mg | INTRAVENOUS | Status: DC | PRN
  Administered 2016-06-18 – 2016-06-19 (×2): 1 mg via INTRAVENOUS
  Filled 2016-06-18 (×2): qty 1

## 2016-06-18 MED ORDER — HYDROMORPHONE HCL 2 MG/ML IJ SOLN
0.5000 mg | INTRAMUSCULAR | Status: DC | PRN
  Administered 2016-06-18: 0.5 mg via INTRAVENOUS
  Filled 2016-06-18 (×2): qty 1

## 2016-06-18 MED ORDER — INSULIN ASPART 100 UNIT/ML ~~LOC~~ SOLN
0.0000 [IU] | SUBCUTANEOUS | Status: DC
  Administered 2016-06-18: 5 [IU] via SUBCUTANEOUS
  Administered 2016-06-19: 1 [IU] via SUBCUTANEOUS
  Administered 2016-06-19: 2 [IU] via SUBCUTANEOUS
  Administered 2016-06-19: 3 [IU] via SUBCUTANEOUS
  Administered 2016-06-19 (×2): 2 [IU] via SUBCUTANEOUS
  Administered 2016-06-19: 1 [IU] via SUBCUTANEOUS
  Administered 2016-06-20 (×3): 2 [IU] via SUBCUTANEOUS
  Administered 2016-06-20: 1 [IU] via SUBCUTANEOUS
  Administered 2016-06-20: 5 [IU] via SUBCUTANEOUS
  Administered 2016-06-21: 3 [IU] via SUBCUTANEOUS
  Administered 2016-06-21 (×2): 1 [IU] via SUBCUTANEOUS
  Administered 2016-06-21: 3 [IU] via SUBCUTANEOUS
  Administered 2016-06-21: 2 [IU] via SUBCUTANEOUS
  Administered 2016-06-22: 3 [IU] via SUBCUTANEOUS
  Administered 2016-06-22: 2 [IU] via SUBCUTANEOUS
  Administered 2016-06-22: 1 [IU] via SUBCUTANEOUS
  Administered 2016-06-22: 2 [IU] via SUBCUTANEOUS

## 2016-06-18 MED ORDER — TRAMADOL-ACETAMINOPHEN 37.5-325 MG PO TABS
1.0000 | ORAL_TABLET | Freq: Two times a day (BID) | ORAL | Status: DC
  Administered 2016-06-20 – 2016-06-25 (×10): 1 via ORAL
  Filled 2016-06-18 (×11): qty 1

## 2016-06-18 MED ORDER — DEXTROSE 5 % IV SOLN
1.0000 g | INTRAVENOUS | Status: DC
  Administered 2016-06-19 – 2016-06-21 (×2): 1 g via INTRAVENOUS
  Filled 2016-06-18 (×2): qty 10

## 2016-06-18 MED ORDER — DILTIAZEM HCL-DEXTROSE 100-5 MG/100ML-% IV SOLN (PREMIX)
5.0000 mg/h | INTRAVENOUS | Status: DC
  Administered 2016-06-18 – 2016-06-21 (×5): 5 mg/h via INTRAVENOUS
  Filled 2016-06-18 (×6): qty 100

## 2016-06-18 MED ORDER — FENTANYL CITRATE (PF) 100 MCG/2ML IJ SOLN
50.0000 ug | Freq: Once | INTRAMUSCULAR | Status: AC
  Administered 2016-06-18: 50 ug via INTRAVENOUS
  Filled 2016-06-18: qty 2

## 2016-06-18 MED ORDER — POLYETHYLENE GLYCOL 3350 17 G PO PACK: 17.0000 g | PACK | Freq: Every day | ORAL | Status: DC | PRN

## 2016-06-18 MED ORDER — FAMOTIDINE 20 MG PO TABS
10.0000 mg | ORAL_TABLET | Freq: Every day | ORAL | Status: DC
  Administered 2016-06-20 – 2016-06-25 (×6): 10 mg via ORAL
  Filled 2016-06-18 (×7): qty 1

## 2016-06-18 MED ORDER — BISACODYL 10 MG RE SUPP: 10.0000 mg | Freq: Every day | RECTAL | Status: DC | PRN

## 2016-06-18 MED ORDER — DEXTROSE 5 % IV SOLN
1.0000 g | Freq: Once | INTRAVENOUS | Status: AC
  Administered 2016-06-18: 1 g via INTRAVENOUS
  Filled 2016-06-18: qty 10

## 2016-06-18 MED ORDER — ONDANSETRON HCL 4 MG/2ML IJ SOLN
4.0000 mg | Freq: Four times a day (QID) | INTRAMUSCULAR | Status: DC | PRN
  Administered 2016-06-18: 4 mg via INTRAVENOUS
  Filled 2016-06-18: qty 2

## 2016-06-18 MED ORDER — METHOCARBAMOL 500 MG PO TABS: 500.0000 mg | ORAL_TABLET | Freq: Four times a day (QID) | ORAL | Status: DC | PRN

## 2016-06-18 MED ORDER — MORPHINE SULFATE (PF) 2 MG/ML IV SOLN
0.5000 mg | INTRAVENOUS | Status: DC | PRN
  Administered 2016-06-19 (×2): 0.5 mg via INTRAVENOUS
  Filled 2016-06-18 (×2): qty 1

## 2016-06-18 MED ORDER — METOPROLOL TARTRATE 5 MG/5ML IV SOLN
5.0000 mg | Freq: Once | INTRAVENOUS | Status: AC
  Administered 2016-06-18: 5 mg via INTRAVENOUS
  Filled 2016-06-18: qty 5

## 2016-06-18 MED ORDER — HYDROCODONE-ACETAMINOPHEN 5-325 MG PO TABS
1.0000 | ORAL_TABLET | Freq: Four times a day (QID) | ORAL | Status: DC | PRN
  Administered 2016-06-19 – 2016-06-20 (×3): 1 via ORAL
  Filled 2016-06-18 (×3): qty 1

## 2016-06-18 MED ORDER — LEVOTHYROXINE SODIUM 100 MCG PO TABS
100.0000 ug | ORAL_TABLET | ORAL | Status: DC
  Administered 2016-06-19 – 2016-06-25 (×5): 100 ug via ORAL
  Filled 2016-06-18 (×5): qty 1

## 2016-06-18 MED ORDER — SODIUM CHLORIDE 0.9 % IV BOLUS (SEPSIS)
500.0000 mL | Freq: Once | INTRAVENOUS | Status: AC
  Administered 2016-06-18: 500 mL via INTRAVENOUS

## 2016-06-18 MED ORDER — MORPHINE SULFATE (PF) 2 MG/ML IV SOLN
0.5000 mg | INTRAVENOUS | Status: DC | PRN
  Administered 2016-06-18: 0.5 mg via INTRAVENOUS
  Filled 2016-06-18: qty 1

## 2016-06-18 MED ORDER — SODIUM CHLORIDE 0.9 % IV BOLUS (SEPSIS)
500.0000 mL | Freq: Once | INTRAVENOUS | Status: AC
Start: 2016-06-18 — End: 2016-06-18
  Administered 2016-06-18: 500 mL via INTRAVENOUS

## 2016-06-18 MED ORDER — LORATADINE 10 MG PO TABS
10.0000 mg | ORAL_TABLET | Freq: Every day | ORAL | Status: DC
  Administered 2016-06-20 – 2016-06-25 (×6): 10 mg via ORAL
  Filled 2016-06-18 (×7): qty 1

## 2016-06-18 MED ORDER — DILTIAZEM LOAD VIA INFUSION
10.0000 mg | Freq: Once | INTRAVENOUS | Status: AC
  Administered 2016-06-18: 10 mg via INTRAVENOUS
  Filled 2016-06-18: qty 10

## 2016-06-18 MED ORDER — METHOCARBAMOL 1000 MG/10ML IJ SOLN
500.0000 mg | Freq: Four times a day (QID) | INTRAVENOUS | Status: DC | PRN
  Filled 2016-06-18: qty 5

## 2016-06-18 NOTE — ED Notes (Signed)
RN in room starting IV and getting labs

## 2016-06-18 NOTE — ED Notes (Signed)
Bed: WA04 Expected date:  Expected time:  Means of arrival:  Comments: EMS Fall 82 yo Hip pain

## 2016-06-18 NOTE — ED Triage Notes (Signed)
Patient is from home, living with daughter .  Patient fell and hit left side of head.  Patient thinks she fell on her glasses.  She is complaining left hip pain.  Fall was unwitnessed.    BP: 199/158 HR: 148 R:20 O2: 99% on room air

## 2016-06-18 NOTE — Progress Notes (Signed)
EDCM spoke to patient and her daughter Brittany Chambers at bedside.  Patient with dementia.  Brittany Chambers reports she is patient's medical POA [phone].  Patient lives at home with her daughter.  [name] reports patient has a walker, bedside commode, shower bench and hospital bed..  Patient's daughter assists patient with ADL's and meals.  Patient's daughter reports patient has had hospice services in the past in [place] for failure to thrive, but since moving here has been doing better.  Patient's daughter reports patient's pcp has placed patient on a low dose antibiotic to help with patient's frequent UTI's and is much better.  Patient is incontinent, wears depends at home.  Patient's daughter reports patient's pcp is Dr. Gwenette Greet.  EDCM provided patient's daughter with list of homehealth agencies in [facility], explained services.  Also explained possibility of rehab.  Patient's daughter thankful for services.  No further EDCM needs at this time.

## 2016-06-18 NOTE — H&P (Signed)
Brittany Chambers [id number] DOB: 12/12/1918 DOA: 06/18/2016     PCP: Leeroy Cha, MD   Outpatient Specialists: none  Patient coming from:    home Lives  With family    Chief Complaint: Wall results in left head pain  HPI: Brittany Chambers is a 82 y.o. female with medical history significant of dementia, recurrent cataract, GERD, hypothyroidism, failure to thrive    Presented with fall today while using the bathroom. Daughter did not but less of fall but heard moderate cry out right away. She was unable to bear weight. The mass was called. Patient was noted to have trauma to her head. She was brought in to [facility] emergency department. Patient has frequent falls she walks with walker severe dementia as her daughter. In the past she had to be on hospice for failure to thrive while living in [place] but since she moved her daughter she's been doing much better.  On arrival blood pressure elevated 199/158 heart rate up to 148 admission does not endorse any history of irregular heartbeat or heart disease. She is not on any anticoagulation as she has no history of severe hypertension.  Regarding pertinent Chronic problems: Patient has been having recurrent urinary tract infection and her primary care provider has started her on prophylactic dose of trimethoprim Family states in the past patient used to have diagnosis of diabetes but since then has been diet-controlled  IN ER:  Temp (24hrs), Avg:98.3 F (36.8 C), Min:97.9 F (36.6 C), Max:98.7 F (37.1 C)      Aspirations 12 after administration of metoprolol heart rate initially went down to 110 blood pressure down to 144/125   Lactic acid 1.75 TSH elevated at 5.188  Sodium 133 glucose 219 Creatinine 1.57 11 months ago is 1.13 WBC 7.4 hemoglobin 15.8 Chest x-ray non acute Left hip plain films showing Subcapital left hip fracture Left tibia non acute CT head and neck no hemorrhage no fracture Following  Medications were ordered in ER: Medications  HYDROmorphone (DILAUDID) injection 0.5 mg (0.5 mg Intravenous Given 06/18/16 1607)  cefTRIAXone (ROCEPHIN) 1 g in dextrose 5 % 50 mL IVPB (not administered)  fentaNYL (SUBLIMAZE) injection 50 mcg (50 mcg Intravenous Given 06/18/16 1439)  sodium chloride 0.9 % bolus 500 mL (0 mLs Intravenous Stopped 06/18/16 1608)  sodium chloride 0.9 % bolus 500 mL (0 mLs Intravenous Stopped 06/18/16 1830)  metoprolol (LOPRESSOR) injection 5 mg (5 mg Intravenous Given 06/18/16 1739)     ER provider discussed case with:  Dr Alvan Dame with Orthopedics recommends operative intervention when ever patient is clinicaly stable Also case discussed with cardiology who recommended metoprolol administration for likely new onset A. fib with RVR Hospitalist was called for admission for left hip fracture new-onset of A. fib with RVR severe hypertension and hyperglycemia  Review of Systems:    Pertinent positives include: hip pain  Constitutional:  No weight loss, night sweats, Fevers, chills, fatigue, weight loss  HEENT:  No headaches, Difficulty swallowing,Tooth/dental problems,Sore throat,  No sneezing, itching, ear ache, nasal congestion, post nasal drip,  Cardio-vascular:  No chest pain, Orthopnea, PND, anasarca, dizziness, palpitations.no Bilateral lower extremity swelling  GI:  No heartburn, indigestion, abdominal pain, nausea, vomiting, diarrhea, change in bowel habits, loss of appetite, melena, blood in stool, hematemesis Resp:  no shortness of breath at rest. No dyspnea on exertion, No excess mucus, no productive cough, No non-productive cough, No coughing up of blood.No change in color of mucus.No wheezing. Skin:  no  rash or lesions. No jaundice GU:  no dysuria, change in color of urine, no urgency or frequency. No straining to urinate.  No flank pain.  Musculoskeletal:  No joint pain or no joint swelling. No decreased range of motion. No back pain.  Psych:    No change in mood or affect. No depression or anxiety. No memory loss.  Neuro: no localizing neurological complaints, no tingling, no weakness, no double vision, no gait abnormality, no slurred speech, no confusion  As per HPI otherwise 10 point review of systems negative.   Past Medical History: Past Medical History:  Diagnosis Date  . Dementia   . GERD (gastroesophageal reflux disease)   . Hypothyroidism   . Macular degeneration bilateral   Past Surgical History:  Procedure Laterality Date  . ABDOMINAL HYSTERECTOMY    . CHOLECYSTECTOMY    . EYE SURGERY       Social History:  Ambulatory  , walker      reports that she has never smoked. She has never used smokeless tobacco. She reports that she drinks alcohol. She reports that she does not use drugs.  Allergies:  No Known Allergies     Family History:   Family History  Problem Relation Age of Onset  . CAD Mother   . CAD Father   . Stroke Neg Hx   . Diabetes Neg Hx     Medications: Prior to Admission medications   Medication Sig Start Date End Date Taking? Authorizing Provider  ALPRAZolam Duanne Moron) 1 MG tablet Take 1 mg by mouth at bedtime.    Yes Historical Provider, MD  levothyroxine (SYNTHROID, LEVOTHROID) 100 MCG tablet Take 100 mcg by mouth as directed. takes 5 days a weej; except on Mondays or Wednesdays   Yes Historical Provider, MD  loratadine (CLARITIN) 10 MG tablet Take 10 mg by mouth daily.   Yes Historical Provider, MD  Melatonin 3 MG TABS Take 3 mg by mouth at bedtime.   Yes Historical Provider, MD  ranitidine (ZANTAC) 150 MG tablet Take 150 mg by mouth 2 (two) times daily.    Yes Historical Provider, MD  traMADol-acetaminophen (ULTRACET) 37.5-325 MG tablet Take 1 tablet by mouth 2 (two) times daily. 05/28/16  Yes Historical Provider, MD  trimethoprim (TRIMPEX) 100 MG tablet Take 100 mg by mouth daily. continuous 05/09/16  Yes Historical Provider, MD  Vitamin D, Cholecalciferol, 1000 units CAPS Take  1,000 Units by mouth daily.    Yes Historical Provider, MD    Physical Exam: Patient Vitals for the past 24 hrs:  BP Temp Temp src Pulse Resp SpO2  06/18/16 1729 (!) 144/125 98.7 F (37.1 C) Rectal (!) 151 12 96 %  06/18/16 1502 (!) 189/123 97.9 F (36.6 C) - (!) 146 18 96 %    1. General:  in No Acute distress 2. Psychological: Alert but not Oriented 3. Head/ENT:     Dry Mucous Membranes                          Head  traumatic laceration to the left side of the head, neck supple                            Poor Dentition 4. SKIN:   decreased Skin turgor,  Skin clean Dry and intact no rash 5. Heart: Regular rate and rhythm no Murmur, Rub or gallop 6. Lungs:  Clear to auscultation bilaterally,  no wheezes or crackles   7. Abdomen: Soft,  non-tender, Non distended 8. Lower extremities: no clubbing, cyanosis, or edema 9. Neurologically Grossly intact, moving all 4 extremities equally except difficult to assess left lower extremity 10. MSK: Normal range of motion except left lower extremity secondary to pain   body mass index is unknown because there is no height or weight on file.  Labs on Admission:   Labs on Admission: I have personally reviewed following labs and imaging studies  CBC:  Recent Labs Lab 06/18/16 1434  WBC 7.4  NEUTROABS 5.3  HGB 15.8*  HCT 45.5  MCV 95.0  PLT [id number]   Basic Metabolic Panel:  Recent Labs Lab 06/18/16 1434  NA 133*  K 4.0  CL 97*  CO2 23  GLUCOSE 219*  BUN 14  CREATININE 1.57*  CALCIUM 9.7   GFR: CrCl cannot be calculated (Unknown ideal weight.). Liver Function Tests: No results for input(s): AST, ALT, ALKPHOS, BILITOT, PROT, ALBUMIN in the last 168 hours. No results for input(s): LIPASE, AMYLASE in the last 168 hours. No results for input(s): AMMONIA in the last 168 hours. Coagulation Profile:  Recent Labs Lab 06/18/16 1434  INR 0.98   Cardiac Enzymes: No results for input(s): CKTOTAL, CKMB, CKMBINDEX, TROPONINI in the  last 168 hours. BNP (last 3 results) No results for input(s): PROBNP in the last 8760 hours. HbA1C: No results for input(s): HGBA1C in the last 72 hours. CBG: No results for input(s): GLUCAP in the last 168 hours. Lipid Profile: No results for input(s): CHOL, HDL, LDLCALC, TRIG, CHOLHDL, LDLDIRECT in the last 72 hours. Thyroid Function Tests:  Recent Labs  06/18/16 1649  TSH 5.188*   Anemia Panel: No results for input(s): VITAMINB12, FOLATE, FERRITIN, TIBC, IRON, RETICCTPCT in the last 72 hours. Urine analysis:    Component Value Date/Time   COLORURINE YELLOW 06/18/2016 1410   APPEARANCEUR HAZY (A) 06/18/2016 1410   LABSPEC 1.013 06/18/2016 1410   PHURINE 5.0 06/18/2016 1410   GLUCOSEU NEGATIVE 06/18/2016 1410   HGBUR SMALL (A) 06/18/2016 1410   BILIRUBINUR NEGATIVE 06/18/2016 1410   KETONESUR NEGATIVE 06/18/2016 1410   PROTEINUR NEGATIVE 06/18/2016 1410   NITRITE POSITIVE (A) 06/18/2016 1410   LEUKOCYTESUR LARGE (A) 06/18/2016 1410   Sepsis Labs: @LABRCNTIP (procalcitonin:4,lacticidven:4) )No results found for this or any previous visit (from the past 240 hour(s)).    UA   evidence of UTI     No results found for: HGBA1C  CrCl cannot be calculated (Unknown ideal weight.).  BNP (last 3 results) No results for input(s): PROBNP in the last 8760 hours.   ECG REPORT  Independently reviewed Rate:147   Rhythm: RBBB wide QRS tachycardia ST&T Change: No acute ischemic changes   QTC 485 Repeat EKG heart rate 111 a. flatter no evidence of ischemic changes   There were no vitals filed for this visit.   Cultures: No results found for: SDES, [place], CULT, REPTSTATUS   Radiological Exams on Admission: Dg Tibia/fibula Left  Result Date: 06/18/2016 CLINICAL DATA:  Fall, left lower leg pain EXAM: LEFT TIBIA AND FIBULA - 2 VIEW COMPARISON:  None. FINDINGS: No fracture or dislocation is seen. Mild degenerative changes with lateral compartment chondrocalcinosis.  Vascular calcifications. IMPRESSION: No acute osseus abnormality is seen. Electronically Signed   By: Julian Hy M.D.   On: 06/18/2016 16:27   Ct Head Wo Contrast  Result Date: 06/18/2016 CLINICAL DATA:  Fall, hit the left side of the head EXAM: CT HEAD WITHOUT CONTRAST CT  CERVICAL SPINE WITHOUT CONTRAST TECHNIQUE: Multidetector CT imaging of the head and cervical spine was performed following the standard protocol without intravenous contrast. Multiplanar CT image reconstructions of the cervical spine were also generated. COMPARISON:  09/04/2015 FINDINGS: CT HEAD FINDINGS Brain: No intracranial hemorrhage, mass effect or midline shift. Stable cerebral atrophy. Stable extensive periventricular and patchy subcortical chronic white matter disease. No acute cortical infarction. No mass lesion is noted on this unenhanced scan. Vascular: Atherosclerotic calcifications of carotid siphon. Skull: No skull fracture is noted. Sinuses/Orbits: No paranasal sinuses air-fluid levels. Other: There is scalp swelling and subcutaneous stranding in left lateral extra orbital region and left temporal/ zygomatic region. CT CERVICAL SPINE FINDINGS Alignment: There is normal alignment. Skull base and vertebrae: No acute fracture or subluxation. Degenerative changes are noted C1-C2 articulation. There is mild anterior and mild posterior spurring at C4-C5 and C5-C6 and C6-C7 level. Mild posterior spurring at C7-T1 level. Soft tissues and spinal canal: No prevertebral soft tissue swelling. Mild spinal canal stenosis due to posterior spurring at C4-C5 and C5-C6 level. Disc levels: Mild disc space flattening at C4-C5, C5-C6 and C6-C7 level. Upper chest: There is no pneumothorax in visualized lung apices. Other: Atherosclerotic calcifications of vertebral arteries are noted. Extensive atherosclerotic calcifications bilateral carotid bifurcation. IMPRESSION: 1. No acute intracranial abnormality. 2. Stable atrophy and extensive  chronic white matter disease. No definite acute cortical infarction. 3. There is scalp swelling and subcutaneous stranding left lateral extra orbital region and left temporal/zygomatic region. Clinical correlation is necessary. 4. No cervical spine acute fracture or subluxation. Multilevel degenerative changes as described above. Electronically Signed   By: Lahoma Crocker M.D.   On: 06/18/2016 15:22   Ct Cervical Spine Wo Contrast  Result Date: 06/18/2016 CLINICAL DATA:  Fall, hit the left side of the head EXAM: CT HEAD WITHOUT CONTRAST CT CERVICAL SPINE WITHOUT CONTRAST TECHNIQUE: Multidetector CT imaging of the head and cervical spine was performed following the standard protocol without intravenous contrast. Multiplanar CT image reconstructions of the cervical spine were also generated. COMPARISON:  09/04/2015 FINDINGS: CT HEAD FINDINGS Brain: No intracranial hemorrhage, mass effect or midline shift. Stable cerebral atrophy. Stable extensive periventricular and patchy subcortical chronic white matter disease. No acute cortical infarction. No mass lesion is noted on this unenhanced scan. Vascular: Atherosclerotic calcifications of carotid siphon. Skull: No skull fracture is noted. Sinuses/Orbits: No paranasal sinuses air-fluid levels. Other: There is scalp swelling and subcutaneous stranding in left lateral extra orbital region and left temporal/ zygomatic region. CT CERVICAL SPINE FINDINGS Alignment: There is normal alignment. Skull base and vertebrae: No acute fracture or subluxation. Degenerative changes are noted C1-C2 articulation. There is mild anterior and mild posterior spurring at C4-C5 and C5-C6 and C6-C7 level. Mild posterior spurring at C7-T1 level. Soft tissues and spinal canal: No prevertebral soft tissue swelling. Mild spinal canal stenosis due to posterior spurring at C4-C5 and C5-C6 level. Disc levels: Mild disc space flattening at C4-C5, C5-C6 and C6-C7 level. Upper chest: There is no  pneumothorax in visualized lung apices. Other: Atherosclerotic calcifications of vertebral arteries are noted. Extensive atherosclerotic calcifications bilateral carotid bifurcation. IMPRESSION: 1. No acute intracranial abnormality. 2. Stable atrophy and extensive chronic white matter disease. No definite acute cortical infarction. 3. There is scalp swelling and subcutaneous stranding left lateral extra orbital region and left temporal/zygomatic region. Clinical correlation is necessary. 4. No cervical spine acute fracture or subluxation. Multilevel degenerative changes as described above. Electronically Signed   By: Lahoma Crocker M.D.   On:  06/18/2016 15:22   Dg Chest Portable 1 View  Result Date: 06/18/2016 CLINICAL DATA:  Hip fracture.  Dementia EXAM: PORTABLE CHEST 1 VIEW COMPARISON:  03/11/2015 FINDINGS: Chronic borderline cardiomegaly, accentuated by technique and rotation. Diffuse atherosclerotic calcification of the aorta. There is no edema, consolidation, effusion, or pneumothorax. Osteopenia. No acute osseous finding. IMPRESSION: No evidence of active disease. Electronically Signed   By: Monte Fantasia M.D.   On: 06/18/2016 16:44   Dg Hip Unilat With Pelvis 2-3 Views Left  Result Date: 06/18/2016 CLINICAL DATA:  Fall, left hip pain EXAM: DG HIP (WITH OR WITHOUT PELVIS) 2-3V LEFT COMPARISON:  None. FINDINGS: Subcapital left hip fracture. Visualized bony pelvis appears intact. Bilobed hip joint spaces are preserved. IMPRESSION: Subcapital left hip fracture. Electronically Signed   By: Julian Hy M.D.   On: 06/18/2016 16:09    Chart has been reviewed    Assessment/Plan  82 y.o. female with medical history significant of dementia, recurrent cataract, GERD, hypothyroidism, failure to thrive being admitted for  left hip fracture new-onset of A. fib with RVR severe hypertension and hyperglycemia   Present on Admission: . Closed left hip fracture ([place]) discussed with Dr. Alvan Dame at bedside  with the patient. Family at this point unsure if they would like to proceed with operative intervention or not considering to switch to [facility] since family members work there in the past. Daughter states she would like to discuss this first with her brother prior to proceeding to operative intervention. Dr. Alvan Dame states he would be able to operate on Saturday morning if family agrees.  Patient is elderly mobility is limited secondary to knee pain but has no history of coronary artery disease she does has history of diabetes. In significant dementia unable to provide her own history. Patient was found to have active A flutter with RVR will treat if flat or with RVR to prior to proceeding to operative intervention   . UTI (urinary tract infection) with Rocephin await results of urine culture . Atrial fibrillation with RVR (HCC) - - Admit to step down on Cardizem drip       CHA2D-VASC score 4       If family chooses to proceed with operative intervention on Saturday by Dr. Alvan Dame and his partners Will start on lovenox perioperatively and for DVT prophylaxis will need to be held in the day prior to OR time. Now until family is undecided in an sure if they would like to switch to a different group old off and continue SCDs          Suspect the long-term patient not a candidate for anticoagulation due to risks of falls         TSH elevated though need to adjust Synthroid dose      Cycle cardiac enzymes      Obtain ECHO      Cardiology consult in AM  . Dementia expect some degree of sundowning while hospitalized . Hypothyroidism patient usually takes 100 mg of Synthroid except for Mondays and Wednesdays will change to every day and see if that helps need to repeat TSH in 3 months  . Hyperglycemia she has known history of diabetes mellitus and has been diet controlled for many years. The hypoglycemia in the setting of stress. We'll order sliding scale ABGs mellitus order sliding scale and check  hemoglobin A1c  Other plan as per orders.  DVT prophylaxis:  lovenox  If family chooses to proceed with operative intervention on Saturday  otherwise for now continue SCDs  Code Status: DNR/DNI as per family   Family Communication:   Family not at  Bedside  plan of care was discussed with Daughter,   Disposition Plan:    likely will need placement for rehabilitation                                                  Would benefit from PT/OT eval prior to DC after operative repair complete.                         Social Work       consulted                          Consults called: orhopedics   Admission status:    inpatient       Level of care        SDU      I have spent a total of 66 min on this admission  extra time was spent to discuss case with Dr. Franne Grip 06/18/2016, 7:45 PM    [facility]  Pager [phone]   after 2 AM please page floor coverage PA If 7AM-7PM, please contact the day team taking care of the patient  [email or web address]  Password [email or web address]

## 2016-06-18 NOTE — ED Provider Notes (Signed)
[place] DEPT Provider Note   CSN: [id number] Arrival date & time: 06/18/16  1351  LEVEL 5 CAVEAT - DEMENTIA   History   Chief Complaint Chief Complaint  Patient presents with  . Fall    HPI Brittany Chambers is a 82 y.o. female.  HPI  82 year old female presents from home after a fall. History is taken from EMS and daughter at the bedside. Just prior to arrival, the patient fell while getting out of the bathroom. The daughter had just come back inside after getting the mail. She heard the patient fall and then immediately start moaning. She does not think she lost consciousness. She thinks she slipped because she had her pants still down after going to the bathroom. The patient is currently complaining of left hip pain. The daughter states that she injured the left side of her body. She is not on blood thinners. She has baseline dementia and seems to be at her normal besides being in pain. Noted by EMS to be tachycardic (140s) and hypertensive. Patient has otherwise been in her normal health.  Past Medical History:  Diagnosis Date  . Macular degeneration bilateral    There are no active problems to display for this patient.   Past Surgical History:  Procedure Laterality Date  . CHOLECYSTECTOMY    . EYE SURGERY      OB History    No data available       Home Medications    Prior to Admission medications   Medication Sig Start Date End Date Taking? Authorizing Provider  ALPRAZolam Duanne Moron) 1 MG tablet Take 1 mg by mouth at bedtime as needed for anxiety.    Historical Provider, MD  levothyroxine (SYNTHROID, LEVOTHROID) 88 MCG tablet Take 88 mcg by mouth daily. 06/04/15   Historical Provider, MD  loratadine (CLARITIN) 10 MG tablet Take 10 mg by mouth daily.    Historical Provider, MD  Melatonin 3 MG TABS Take 3 mg by mouth at bedtime.    Historical Provider, MD  ranitidine (ZANTAC) 150 MG tablet Take 150 mg by mouth daily.    Historical Provider, MD  traMADol  (ULTRAM) 50 MG tablet Take 50-100 mg by mouth every 6 (six) hours as needed for moderate pain.     Historical Provider, MD  Vitamin D, Cholecalciferol, 1000 units CAPS Take 1,000 Units by mouth daily.     Historical Provider, MD    Family History No family history on file.  Social History Social History  Substance Use Topics  . Smoking status: Never Smoker  . Smokeless tobacco: Not on file  . Alcohol use No     Allergies   Patient has no known allergies.   Review of Systems Review of Systems  Unable to perform ROS: Dementia     Physical Exam Updated Vital Signs BP (!) 116/58   Pulse 73   Temp 98.2 F (36.8 C) (Axillary)   Resp 13   Ht 5\' 1"  (1.549 m)   Wt 127 lb 13.9 oz (58 kg)   SpO2 100%   BMI 24.16 kg/m   Physical Exam  Constitutional: She appears well-developed and well-nourished. Cervical collar in place.  HENT:  Head: Normocephalic. Head is with contusion and with laceration.    Right Ear: External ear normal.  Left Ear: External ear normal.  Nose: Nose normal.  Eyes: Right eye exhibits no discharge. Left eye exhibits no discharge.  Cardiovascular: Regular rhythm, normal heart sounds and intact distal pulses.  Tachycardia present.  Pulses:      Dorsalis pedis pulses are 2+ on the right side, and 2+ on the left side.  Pulmonary/Chest: Effort normal and breath sounds normal.  Abdominal: Soft. She exhibits no distension. There is no tenderness.  Musculoskeletal:       Left hip: She exhibits decreased range of motion and tenderness.       Left knee: No tenderness found.       Left ankle: No tenderness.       Left upper leg: She exhibits no tenderness.       Left lower leg: She exhibits tenderness (mid-lateral).       Left foot: There is no tenderness.  Left leg appears to be internally rotated  Neurological: She is alert. She is disoriented.  Skin: Skin is warm and dry.  Nursing note and vitals reviewed.    ED Treatments / Results  Labs (all  labs ordered are listed, but only abnormal results are displayed) Labs Reviewed  BASIC METABOLIC PANEL - Abnormal; Notable for the following:       Result Value   Sodium 133 (*)    Chloride 97 (*)    Glucose, Bld 219 (*)    Creatinine, Ser 1.57 (*)    GFR calc non Af Amer 27 (*)    GFR calc Af Amer 31 (*)    All other components within normal limits  CBC WITH DIFFERENTIAL/PLATELET - Abnormal; Notable for the following:    Hemoglobin 15.8 (*)    All other components within normal limits  URINALYSIS, ROUTINE W REFLEX MICROSCOPIC - Abnormal; Notable for the following:    APPearance HAZY (*)    Hgb urine dipstick SMALL (*)    Nitrite POSITIVE (*)    Leukocytes, UA LARGE (*)    Bacteria, UA RARE (*)    All other components within normal limits  TSH - Abnormal; Notable for the following:    TSH 5.188 (*)    All other components within normal limits  TROPONIN I - Abnormal; Notable for the following:    Troponin I 0.03 (*)    All other components within normal limits  GLUCOSE, CAPILLARY - Abnormal; Notable for the following:    Glucose-Capillary 231 (*)    All other components within normal limits  MRSA PCR SCREENING  URINE CULTURE  PROTIME-INR  TROPONIN I  HEMOGLOBIN A1C  CBC  BASIC METABOLIC PANEL  TROPONIN I  TROPONIN I  PROTIME-INR  ALBUMIN  VITAMIN D 25 HYDROXY (VIT D DEFICIENCY, FRACTURES)  I-STAT CG4 LACTIC ACID, ED  TYPE AND SCREEN  ABO/RH    EKG  EKG Interpretation  Date/Time:  Thursday June 18 2016 14:18:24 EST Ventricular Rate:  147 PR Interval:    QRS Duration: 127 QT Interval:  311 QTC Calculation: 487 R Axis:   92 Text Interpretation:  Wide-QRS tachycardia RBBB and LPFB No old tracing to compare artifact V6 Confirmed by Chadley Dziedzic MD, Chloe Baig [phone]) on 06/18/2016 3:00:49 PM       EKG Interpretation  Date/Time:  Thursday June 18 2016 18:08:51 EST Ventricular Rate:  115 PR Interval:    QRS Duration: 129 QT Interval:  344 QTC  Calculation: 476 R Axis:   83 Text Interpretation:  Atrial fibrillation Right bundle branch block rate is slower compared to earlier in the day Confirmed by Kynzli Rease MD, Brylie Sneath [phone]) on 06/19/2016 12:37:12 AM        Radiology Dg Tibia/fibula Left  Result Date: 06/18/2016 CLINICAL DATA:  Fall, left lower  leg pain EXAM: LEFT TIBIA AND FIBULA - 2 VIEW COMPARISON:  None. FINDINGS: No fracture or dislocation is seen. Mild degenerative changes with lateral compartment chondrocalcinosis. Vascular calcifications. IMPRESSION: No acute osseus abnormality is seen. Electronically Signed   By: Julian Hy M.D.   On: 06/18/2016 16:27   Ct Head Wo Contrast  Result Date: 06/18/2016 CLINICAL DATA:  Fall, hit the left side of the head EXAM: CT HEAD WITHOUT CONTRAST CT CERVICAL SPINE WITHOUT CONTRAST TECHNIQUE: Multidetector CT imaging of the head and cervical spine was performed following the standard protocol without intravenous contrast. Multiplanar CT image reconstructions of the cervical spine were also generated. COMPARISON:  09/04/2015 FINDINGS: CT HEAD FINDINGS Brain: No intracranial hemorrhage, mass effect or midline shift. Stable cerebral atrophy. Stable extensive periventricular and patchy subcortical chronic white matter disease. No acute cortical infarction. No mass lesion is noted on this unenhanced scan. Vascular: Atherosclerotic calcifications of carotid siphon. Skull: No skull fracture is noted. Sinuses/Orbits: No paranasal sinuses air-fluid levels. Other: There is scalp swelling and subcutaneous stranding in left lateral extra orbital region and left temporal/ zygomatic region. CT CERVICAL SPINE FINDINGS Alignment: There is normal alignment. Skull base and vertebrae: No acute fracture or subluxation. Degenerative changes are noted C1-C2 articulation. There is mild anterior and mild posterior spurring at C4-C5 and C5-C6 and C6-C7 level. Mild posterior spurring at C7-T1 level. Soft tissues and  spinal canal: No prevertebral soft tissue swelling. Mild spinal canal stenosis due to posterior spurring at C4-C5 and C5-C6 level. Disc levels: Mild disc space flattening at C4-C5, C5-C6 and C6-C7 level. Upper chest: There is no pneumothorax in visualized lung apices. Other: Atherosclerotic calcifications of vertebral arteries are noted. Extensive atherosclerotic calcifications bilateral carotid bifurcation. IMPRESSION: 1. No acute intracranial abnormality. 2. Stable atrophy and extensive chronic white matter disease. No definite acute cortical infarction. 3. There is scalp swelling and subcutaneous stranding left lateral extra orbital region and left temporal/zygomatic region. Clinical correlation is necessary. 4. No cervical spine acute fracture or subluxation. Multilevel degenerative changes as described above. Electronically Signed   By: Lahoma Crocker M.D.   On: 06/18/2016 15:22   Ct Cervical Spine Wo Contrast  Result Date: 06/18/2016 CLINICAL DATA:  Fall, hit the left side of the head EXAM: CT HEAD WITHOUT CONTRAST CT CERVICAL SPINE WITHOUT CONTRAST TECHNIQUE: Multidetector CT imaging of the head and cervical spine was performed following the standard protocol without intravenous contrast. Multiplanar CT image reconstructions of the cervical spine were also generated. COMPARISON:  09/04/2015 FINDINGS: CT HEAD FINDINGS Brain: No intracranial hemorrhage, mass effect or midline shift. Stable cerebral atrophy. Stable extensive periventricular and patchy subcortical chronic white matter disease. No acute cortical infarction. No mass lesion is noted on this unenhanced scan. Vascular: Atherosclerotic calcifications of carotid siphon. Skull: No skull fracture is noted. Sinuses/Orbits: No paranasal sinuses air-fluid levels. Other: There is scalp swelling and subcutaneous stranding in left lateral extra orbital region and left temporal/ zygomatic region. CT CERVICAL SPINE FINDINGS Alignment: There is normal alignment.  Skull base and vertebrae: No acute fracture or subluxation. Degenerative changes are noted C1-C2 articulation. There is mild anterior and mild posterior spurring at C4-C5 and C5-C6 and C6-C7 level. Mild posterior spurring at C7-T1 level. Soft tissues and spinal canal: No prevertebral soft tissue swelling. Mild spinal canal stenosis due to posterior spurring at C4-C5 and C5-C6 level. Disc levels: Mild disc space flattening at C4-C5, C5-C6 and C6-C7 level. Upper chest: There is no pneumothorax in visualized lung apices. Other: Atherosclerotic calcifications of  vertebral arteries are noted. Extensive atherosclerotic calcifications bilateral carotid bifurcation. IMPRESSION: 1. No acute intracranial abnormality. 2. Stable atrophy and extensive chronic white matter disease. No definite acute cortical infarction. 3. There is scalp swelling and subcutaneous stranding left lateral extra orbital region and left temporal/zygomatic region. Clinical correlation is necessary. 4. No cervical spine acute fracture or subluxation. Multilevel degenerative changes as described above. Electronically Signed   By: Lahoma Crocker M.D.   On: 06/18/2016 15:22   Dg Chest Portable 1 View  Result Date: 06/18/2016 CLINICAL DATA:  Hip fracture.  Dementia EXAM: PORTABLE CHEST 1 VIEW COMPARISON:  03/11/2015 FINDINGS: Chronic borderline cardiomegaly, accentuated by technique and rotation. Diffuse atherosclerotic calcification of the aorta. There is no edema, consolidation, effusion, or pneumothorax. Osteopenia. No acute osseous finding. IMPRESSION: No evidence of active disease. Electronically Signed   By: Monte Fantasia M.D.   On: 06/18/2016 16:44   Dg Hip Unilat With Pelvis 2-3 Views Left  Result Date: 06/18/2016 CLINICAL DATA:  Fall, left hip pain EXAM: DG HIP (WITH OR WITHOUT PELVIS) 2-3V LEFT COMPARISON:  None. FINDINGS: Subcapital left hip fracture. Visualized bony pelvis appears intact. Bilobed hip joint spaces are preserved.  IMPRESSION: Subcapital left hip fracture. Electronically Signed   By: Julian Hy M.D.   On: 06/18/2016 16:09    Procedures Procedures (including critical care time)  CRITICAL CARE Performed by: Sherwood Gambler T   Total critical care time: 30 minutes  Critical care time was exclusive of separately billable procedures and treating other patients.  Critical care was necessary to treat or prevent imminent or life-threatening deterioration.  Critical care was time spent personally by me on the following activities: development of treatment plan with patient and/or surrogate as well as nursing, discussions with consultants, evaluation of patient's response to treatment, examination of patient, obtaining history from patient or surrogate, ordering and performing treatments and interventions, ordering and review of laboratory studies, ordering and review of radiographic studies, pulse oximetry and re-evaluation of patient's condition.   Medications Ordered in ED Medications  fentaNYL (SUBLIMAZE) injection 50 mcg (not administered)  sodium chloride 0.9 % bolus 500 mL (not administered)     Initial Impression / Assessment and Plan / ED Course  I have reviewed the triage vital signs and the nursing notes.  Pertinent labs & imaging results that were available during my care of the patient were reviewed by me and considered in my medical decision making (see chart for details).  Clinical Course as of Jun 19 36  Thu Jun 18, 2016  1408 Patient likely has hip fracture. Will xray left hip, tib/fib, and CT head/c-spine. No reproducible jaw tenderness on exam. Tetanus up to date. Fentanyl for pain. Fluids, ECG, labs.  [SG]  [id number] D/w cardiology, Dr Acie Fredrickson recommends 5 mg metoprolol, fluids, pain control. At this point she has gotten some fluids and appears that her pain is controlled as she is resting comfortably. I don't think this is pain mediated.  [SG]  [id number] Dr. Roel Cluck to admit. HR now  low 100s, BP improved into 110s.   [SG]    Clinical Course User Index [SG] Sherwood Gambler, MD    Patient's HR is out of proportion to pain (remains in 140s even with good pain control). No fever. WBC normal. Has UTI but I don't think this is sepsis or from infectioun. When given IV metoprolol HR now in low 100s and BP lower (130s). Repeat ECG shows afib/irregular rhythm. Likely was flutter with HR In 140s as  it appeared regular. Dr Alvan Dame consulted for ortho. Dr. Roel Cluck to admit for hospitalist.   Final Clinical Impressions(s) / ED Diagnoses   Final diagnoses:  Closed subcapital fracture of left femur, initial encounter [facility])  Atrial fibrillation with [facility])    New Prescriptions New Prescriptions   No medications on file     Sherwood Gambler, MD 06/19/16 [phone]

## 2016-06-19 ENCOUNTER — Other Ambulatory Visit (HOSPITAL_COMMUNITY): Payer: Commercial Managed Care - HMO

## 2016-06-19 ENCOUNTER — Encounter (HOSPITAL_COMMUNITY): Payer: Self-pay | Admitting: Cardiology

## 2016-06-19 DIAGNOSIS — S72002A Fracture of unspecified part of neck of left femur, initial encounter for closed fracture: Secondary | ICD-10-CM

## 2016-06-19 DIAGNOSIS — R7989 Other specified abnormal findings of blood chemistry: Secondary | ICD-10-CM

## 2016-06-19 DIAGNOSIS — R778 Other specified abnormalities of plasma proteins: Secondary | ICD-10-CM | POA: Diagnosis present

## 2016-06-19 DIAGNOSIS — W19XXXA Unspecified fall, initial encounter: Secondary | ICD-10-CM

## 2016-06-19 DIAGNOSIS — I4510 Unspecified right bundle-branch block: Secondary | ICD-10-CM | POA: Diagnosis present

## 2016-06-19 DIAGNOSIS — Y92009 Unspecified place in unspecified non-institutional (private) residence as the place of occurrence of the external cause: Secondary | ICD-10-CM

## 2016-06-19 DIAGNOSIS — S72012A Unspecified intracapsular fracture of left femur, initial encounter for closed fracture: Principal | ICD-10-CM

## 2016-06-19 DIAGNOSIS — R748 Abnormal levels of other serum enzymes: Secondary | ICD-10-CM

## 2016-06-19 DIAGNOSIS — W19XXXD Unspecified fall, subsequent encounter: Secondary | ICD-10-CM

## 2016-06-19 DIAGNOSIS — Y92099 Unspecified place in other non-institutional residence as the place of occurrence of the external cause: Secondary | ICD-10-CM

## 2016-06-19 DIAGNOSIS — I4892 Unspecified atrial flutter: Secondary | ICD-10-CM

## 2016-06-19 LAB — CBC
HEMATOCRIT: 41 % (ref 36.0–46.0)
Hemoglobin: 15 g/dL (ref 12.0–15.0)
MCH: 33.9 pg (ref 26.0–34.0)
MCHC: 36.6 g/dL — AB (ref 30.0–36.0)
MCV: 92.8 fL (ref 78.0–100.0)
PLATELETS: 158 10*3/uL (ref 150–400)
RBC: 4.42 MIL/uL (ref 3.87–5.11)
RDW: 13.5 % (ref 11.5–15.5)
WBC: 14.6 10*3/uL — AB (ref 4.0–10.5)

## 2016-06-19 LAB — BASIC METABOLIC PANEL
ANION GAP: 9 (ref 5–15)
BUN: 16 mg/dL (ref 6–20)
CALCIUM: 8.9 mg/dL (ref 8.9–10.3)
CO2: 21 mmol/L — AB (ref 22–32)
Chloride: 101 mmol/L (ref 101–111)
Creatinine, Ser: 1.46 mg/dL — ABNORMAL HIGH (ref 0.44–1.00)
GFR calc Af Amer: 34 mL/min — ABNORMAL LOW (ref >60)
GFR calc non Af Amer: 29 mL/min — ABNORMAL LOW (ref >60)
GLUCOSE: 236 mg/dL — AB (ref 65–99)
Potassium: 4.4 mmol/L (ref 3.5–5.1)
Sodium: 131 mmol/L — ABNORMAL LOW (ref 135–145)

## 2016-06-19 LAB — GLUCOSE, CAPILLARY
GLUCOSE-CAPILLARY: 146 mg/dL — AB (ref 65–99)
GLUCOSE-CAPILLARY: 231 mg/dL — AB (ref 65–99)
Glucose-Capillary: 289 mg/dL — ABNORMAL HIGH (ref 65–99)
Glucose-Capillary: 189 mg/dL — ABNORMAL HIGH (ref 65–99)
Glucose-Capillary: 164 mg/dL — ABNORMAL HIGH (ref 65–99)
Glucose-Capillary: 138 mg/dL — ABNORMAL HIGH (ref 65–99)
Glucose-Capillary: 184 mg/dL — ABNORMAL HIGH (ref 65–99)

## 2016-06-19 LAB — TROPONIN I
Troponin I: 0.09 ng/mL (ref <0.03)
Troponin I: 0.11 ng/mL (ref <0.03)
Troponin I: 0.11 ng/mL (ref <0.03)
Troponin I: 0.1 ng/mL (ref <0.03)

## 2016-06-19 LAB — ALBUMIN: Albumin: 3.9 g/dL (ref 3.5–5.0)

## 2016-06-19 LAB — PROTIME-INR
INR: 1.03
Prothrombin Time: 13.5 seconds (ref 11.4–15.2)

## 2016-06-19 MED ORDER — LORAZEPAM 2 MG/ML IJ SOLN
0.2500 mg | INTRAMUSCULAR | Status: AC | PRN
  Administered 2016-06-19 (×2): 0.25 mg via INTRAVENOUS
  Filled 2016-06-19 (×2): qty 1

## 2016-06-19 MED ORDER — CARVEDILOL 3.125 MG PO TABS: 3.1250 mg | ORAL_TABLET | Freq: Two times a day (BID) | ORAL | Status: DC

## 2016-06-19 MED ORDER — INSULIN GLARGINE 100 UNIT/ML ~~LOC~~ SOLN
5.0000 [IU] | Freq: Every day | SUBCUTANEOUS | Status: DC
  Administered 2016-06-19 – 2016-06-23 (×5): 5 [IU] via SUBCUTANEOUS
  Filled 2016-06-19 (×6): qty 0.05

## 2016-06-19 MED ORDER — DEXTROSE 5 % IV SOLN
3.0000 g | INTRAVENOUS | Status: DC
  Filled 2016-06-19 (×2): qty 3000

## 2016-06-19 MED ORDER — VITAMINS A & D EX OINT
TOPICAL_OINTMENT | CUTANEOUS | Status: AC
  Administered 2016-06-19: 1
  Filled 2016-06-19: qty 5

## 2016-06-19 NOTE — Consult Note (Signed)
Reason for Consult:  Subcapital left hip fracture Referring Physician:   ED physician  Brittany Chambers is an 82 y.o. female.  HPI:  Patient with a medical history significant of dementia, GERD, hypothyroidism, diet controlled DM, chronic UTIs,and failure to thrive.  She lives at home with her daughter and was admitted 06/18/16, after she fell in the bathroom and suffered a left hip fracture.  Orthopedics was consulted.  Dr. Alvan Dame saw the patient and has asked Dr. Lyla Glassing to take over orthopedic care of the patient, which he has agreed.    On arrival to the her blood pressure elevated 199/158 heart rate up to 148 admission. EKG shows atrial flutter with 2:1 conduction and RBBB.  Cardiology was consulted.  Patient placed on a Cardizem drip in place in the step down unit. If the patient is cleared for surgery Dr. Lyla Glassing plans on doing surgery on 06/20/2016.   Past Medical History:  Diagnosis Date  . Dementia   . GERD (gastroesophageal reflux disease)   . Hypothyroidism   . Macular degeneration bilateral    Past Surgical History:  Procedure Laterality Date  . ABDOMINAL HYSTERECTOMY    . CHOLECYSTECTOMY    . EYE SURGERY      Family History  Problem Relation Age of Onset  . CAD Mother   . CAD Father   . Stroke Neg Hx   . Diabetes Neg Hx     Social History:  reports that she has never smoked. She has never used smokeless tobacco. She reports that she drinks alcohol. She reports that she does not use drugs.  Allergies: No Known Allergies    Results for orders placed or performed during the hospital encounter of 06/18/16 (from the past 48 hour(s))  Urinalysis, Routine w reflex microscopic     Status: Abnormal   Collection Time: 06/18/16  2:10 PM  Result Value Ref Range   Color, Urine YELLOW YELLOW   APPearance HAZY (A) CLEAR   Specific Gravity, Urine 1.013 1.005 - 1.030   pH 5.0 5.0 - 8.0   Glucose, UA NEGATIVE NEGATIVE mg/dL   Hgb urine dipstick SMALL (A) NEGATIVE    Bilirubin Urine NEGATIVE NEGATIVE   Ketones, ur NEGATIVE NEGATIVE mg/dL   Protein, ur NEGATIVE NEGATIVE mg/dL   Nitrite POSITIVE (A) NEGATIVE   Leukocytes, UA LARGE (A) NEGATIVE   RBC / HPF 0-5 0 - 5 RBC/hpf   WBC, UA 6-30 0 - 5 WBC/hpf   Bacteria, UA RARE (A) NONE SEEN   Squamous Epithelial / LPF NONE SEEN NONE SEEN   Mucous PRESENT    Hyaline Casts, UA PRESENT   Type and screen [place]     Status: None   Collection Time: 06/18/16  2:32 PM  Result Value Ref Range   ABO/RH(D) B NEG    Antibody Screen NEG    Sample Expiration 06/21/2016   ABO/Rh     Status: None   Collection Time: 06/18/16  2:32 PM  Result Value Ref Range   ABO/RH(D) B NEG   Basic metabolic panel     Status: Abnormal   Collection Time: 06/18/16  2:34 PM  Result Value Ref Range   Sodium 133 (L) 135 - 145 mmol/L   Potassium 4.0 3.5 - 5.1 mmol/L   Chloride 97 (L) 101 - 111 mmol/L   CO2 23 22 - 32 mmol/L   Glucose, Bld 219 (H) 65 - 99 mg/dL   BUN 14 6 - 20 mg/dL  Creatinine, Ser 1.57 (H) 0.44 - 1.00 mg/dL   Calcium 9.7 8.9 - 10.3 mg/dL   GFR calc non Af Amer 27 (L) >60 mL/min   GFR calc Af Amer 31 (L) >60 mL/min    Comment: (NOTE) The eGFR has been calculated using the CKD EPI equation. This calculation has not been validated in all clinical situations. eGFR's persistently <60 mL/min signify possible Chronic Kidney Disease.    Anion gap 13 5 - 15  CBC WITH DIFFERENTIAL     Status: Abnormal   Collection Time: 06/18/16  2:34 PM  Result Value Ref Range   WBC 7.4 4.0 - 10.5 K/uL   RBC 4.79 3.87 - 5.11 MIL/uL   Hemoglobin 15.8 (H) 12.0 - 15.0 g/dL   HCT 45.5 36.0 - 46.0 %   MCV 95.0 78.0 - 100.0 fL   MCH 33.0 26.0 - 34.0 pg   MCHC 34.7 30.0 - 36.0 g/dL   RDW 13.3 11.5 - 15.5 %   Platelets 172 150 - 400 K/uL   Neutrophils Relative % 72 %   Neutro Abs 5.3 1.7 - 7.7 K/uL   Lymphocytes Relative 17 %   Lymphs Abs 1.3 0.7 - 4.0 K/uL   Monocytes Relative 8 %   Monocytes Absolute 0.6  0.1 - 1.0 K/uL   Eosinophils Relative 3 %   Eosinophils Absolute 0.2 0.0 - 0.7 K/uL   Basophils Relative 0 %   Basophils Absolute 0.0 0.0 - 0.1 K/uL  Protime-INR     Status: None   Collection Time: 06/18/16  2:34 PM  Result Value Ref Range   Prothrombin Time 13.0 11.4 - 15.2 seconds   INR 0.98   TSH     Status: Abnormal   Collection Time: 06/18/16  4:49 PM  Result Value Ref Range   TSH 5.188 (H) 0.350 - 4.500 uIU/mL    Comment: Performed by a 3rd Generation assay with a functional sensitivity of <=0.01 uIU/mL.  I-Stat CG4 Lactic Acid, ED     Status: None   Collection Time: 06/18/16  4:58 PM  Result Value Ref Range   Lactic Acid, Venous 1.75 0.5 - 1.9 mmol/L  Troponin I     Status: None   Collection Time: 06/18/16  6:40 PM  Result Value Ref Range   Troponin I <0.03 <0.03 ng/mL  MRSA PCR Screening     Status: None   Collection Time: 06/18/16  8:48 PM  Result Value Ref Range   MRSA by PCR NEGATIVE NEGATIVE    Comment:        The GeneXpert MRSA Assay (FDA approved for NASAL specimens only), is one component of a comprehensive MRSA colonization surveillance program. It is not intended to diagnose MRSA infection nor to guide or monitor treatment for MRSA infections.   Troponin I     Status: Abnormal   Collection Time: 06/18/16  8:57 PM  Result Value Ref Range   Troponin I 0.03 (HH) <0.03 ng/mL    Comment: CRITICAL RESULT CALLED TO, READ BACK BY AND VERIFIED WITH: A [facility] RN [id number] 70/96/28 A NAVARRO   Glucose, capillary     Status: Abnormal   Collection Time: 06/19/16 12:01 AM  Result Value Ref Range   Glucose-Capillary 231 (H) 65 - 99 mg/dL  CBC     Status: Abnormal   Collection Time: 06/19/16  1:57 AM  Result Value Ref Range   WBC 14.6 (H) 4.0 - 10.5 K/uL   RBC 4.42 3.87 - 5.11 MIL/uL  Hemoglobin 15.0 12.0 - 15.0 g/dL   HCT 41.0 36.0 - 46.0 %   MCV 92.8 78.0 - 100.0 fL   MCH 33.9 26.0 - 34.0 pg   MCHC 36.6 (H) 30.0 - 36.0 g/dL   RDW 13.5 11.5 - 15.5 %    Platelets 158 150 - 400 K/uL  Basic metabolic panel     Status: Abnormal   Collection Time: 06/19/16  1:57 AM  Result Value Ref Range   Sodium 131 (L) 135 - 145 mmol/L   Potassium 4.4 3.5 - 5.1 mmol/L   Chloride 101 101 - 111 mmol/L   CO2 21 (L) 22 - 32 mmol/L   Glucose, Bld 236 (H) 65 - 99 mg/dL   BUN 16 6 - 20 mg/dL   Creatinine, Ser 1.46 (H) 0.44 - 1.00 mg/dL   Calcium 8.9 8.9 - 10.3 mg/dL   GFR calc non Af Amer 29 (L) >60 mL/min   GFR calc Af Amer 34 (L) >60 mL/min    Comment: (NOTE) The eGFR has been calculated using the CKD EPI equation. This calculation has not been validated in all clinical situations. eGFR's persistently <60 mL/min signify possible Chronic Kidney Disease.    Anion gap 9 5 - 15  Troponin I     Status: Abnormal   Collection Time: 06/19/16  1:57 AM  Result Value Ref Range   Troponin I 0.09 (HH) <0.03 ng/mL    Comment: CRITICAL VALUE NOTED.  VALUE IS CONSISTENT WITH PREVIOUSLY REPORTED AND CALLED VALUE.  Protime-INR     Status: None   Collection Time: 06/19/16  1:57 AM  Result Value Ref Range   Prothrombin Time 13.5 11.4 - 15.2 seconds   INR 1.03   Albumin     Status: None   Collection Time: 06/19/16  1:57 AM  Result Value Ref Range   Albumin 3.9 3.5 - 5.0 g/dL  Glucose, capillary     Status: Abnormal   Collection Time: 06/19/16  3:39 AM  Result Value Ref Range   Glucose-Capillary 189 (H) 65 - 99 mg/dL   Comment 1 Notify RN     Dg Tibia/fibula Left  Result Date: 06/18/2016 CLINICAL DATA:  Fall, left lower leg pain EXAM: LEFT TIBIA AND FIBULA - 2 VIEW COMPARISON:  None. FINDINGS: No fracture or dislocation is seen. Mild degenerative changes with lateral compartment chondrocalcinosis. Vascular calcifications. IMPRESSION: No acute osseus abnormality is seen. Electronically Signed   By: Julian Hy M.D.   On: 06/18/2016 16:27   Ct Head Wo Contrast  Result Date: 06/18/2016 CLINICAL DATA:  Fall, hit the left side of the head EXAM: CT HEAD  WITHOUT CONTRAST CT CERVICAL SPINE WITHOUT CONTRAST TECHNIQUE: Multidetector CT imaging of the head and cervical spine was performed following the standard protocol without intravenous contrast. Multiplanar CT image reconstructions of the cervical spine were also generated. COMPARISON:  09/04/2015 FINDINGS: CT HEAD FINDINGS Brain: No intracranial hemorrhage, mass effect or midline shift. Stable cerebral atrophy. Stable extensive periventricular and patchy subcortical chronic white matter disease. No acute cortical infarction. No mass lesion is noted on this unenhanced scan. Vascular: Atherosclerotic calcifications of carotid siphon. Skull: No skull fracture is noted. Sinuses/Orbits: No paranasal sinuses air-fluid levels. Other: There is scalp swelling and subcutaneous stranding in left lateral extra orbital region and left temporal/ zygomatic region. CT CERVICAL SPINE FINDINGS Alignment: There is normal alignment. Skull base and vertebrae: No acute fracture or subluxation. Degenerative changes are noted C1-C2 articulation. There is mild anterior and mild posterior  spurring at C4-C5 and C5-C6 and C6-C7 level. Mild posterior spurring at C7-T1 level. Soft tissues and spinal canal: No prevertebral soft tissue swelling. Mild spinal canal stenosis due to posterior spurring at C4-C5 and C5-C6 level. Disc levels: Mild disc space flattening at C4-C5, C5-C6 and C6-C7 level. Upper chest: There is no pneumothorax in visualized lung apices. Other: Atherosclerotic calcifications of vertebral arteries are noted. Extensive atherosclerotic calcifications bilateral carotid bifurcation. IMPRESSION: 1. No acute intracranial abnormality. 2. Stable atrophy and extensive chronic white matter disease. No definite acute cortical infarction. 3. There is scalp swelling and subcutaneous stranding left lateral extra orbital region and left temporal/zygomatic region. Clinical correlation is necessary. 4. No cervical spine acute fracture or  subluxation. Multilevel degenerative changes as described above. Electronically Signed   By: Lahoma Crocker M.D.   On: 06/18/2016 15:22   Ct Cervical Spine Wo Contrast  Result Date: 06/18/2016 CLINICAL DATA:  Fall, hit the left side of the head EXAM: CT HEAD WITHOUT CONTRAST CT CERVICAL SPINE WITHOUT CONTRAST TECHNIQUE: Multidetector CT imaging of the head and cervical spine was performed following the standard protocol without intravenous contrast. Multiplanar CT image reconstructions of the cervical spine were also generated. COMPARISON:  09/04/2015 FINDINGS: CT HEAD FINDINGS Brain: No intracranial hemorrhage, mass effect or midline shift. Stable cerebral atrophy. Stable extensive periventricular and patchy subcortical chronic white matter disease. No acute cortical infarction. No mass lesion is noted on this unenhanced scan. Vascular: Atherosclerotic calcifications of carotid siphon. Skull: No skull fracture is noted. Sinuses/Orbits: No paranasal sinuses air-fluid levels. Other: There is scalp swelling and subcutaneous stranding in left lateral extra orbital region and left temporal/ zygomatic region. CT CERVICAL SPINE FINDINGS Alignment: There is normal alignment. Skull base and vertebrae: No acute fracture or subluxation. Degenerative changes are noted C1-C2 articulation. There is mild anterior and mild posterior spurring at C4-C5 and C5-C6 and C6-C7 level. Mild posterior spurring at C7-T1 level. Soft tissues and spinal canal: No prevertebral soft tissue swelling. Mild spinal canal stenosis due to posterior spurring at C4-C5 and C5-C6 level. Disc levels: Mild disc space flattening at C4-C5, C5-C6 and C6-C7 level. Upper chest: There is no pneumothorax in visualized lung apices. Other: Atherosclerotic calcifications of vertebral arteries are noted. Extensive atherosclerotic calcifications bilateral carotid bifurcation. IMPRESSION: 1. No acute intracranial abnormality. 2. Stable atrophy and extensive chronic  white matter disease. No definite acute cortical infarction. 3. There is scalp swelling and subcutaneous stranding left lateral extra orbital region and left temporal/zygomatic region. Clinical correlation is necessary. 4. No cervical spine acute fracture or subluxation. Multilevel degenerative changes as described above. Electronically Signed   By: Lahoma Crocker M.D.   On: 06/18/2016 15:22   Dg Chest Portable 1 View  Result Date: 06/18/2016 CLINICAL DATA:  Hip fracture.  Dementia EXAM: PORTABLE CHEST 1 VIEW COMPARISON:  03/11/2015 FINDINGS: Chronic borderline cardiomegaly, accentuated by technique and rotation. Diffuse atherosclerotic calcification of the aorta. There is no edema, consolidation, effusion, or pneumothorax. Osteopenia. No acute osseous finding. IMPRESSION: No evidence of active disease. Electronically Signed   By: Monte Fantasia M.D.   On: 06/18/2016 16:44   Dg Hip Unilat With Pelvis 2-3 Views Left  Result Date: 06/18/2016 CLINICAL DATA:  Fall, left hip pain EXAM: DG HIP (WITH OR WITHOUT PELVIS) 2-3V LEFT COMPARISON:  None. FINDINGS: Subcapital left hip fracture. Visualized bony pelvis appears intact. Bilobed hip joint spaces are preserved. IMPRESSION: Subcapital left hip fracture. Electronically Signed   By: Julian Hy M.D.   On: 06/18/2016 16:09  Review of Systems  Constitutional: Negative.   HENT: Negative.   Eyes: Negative.   Respiratory: Negative.   Cardiovascular: Negative.   Gastrointestinal: Positive for heartburn.  Genitourinary: Negative.   Musculoskeletal: Positive for joint pain.  Skin: Negative.   Neurological: Negative.   Endo/Heme/Allergies: Negative.   Psychiatric/Behavioral: Positive for memory loss.   Blood pressure 120/63, pulse 75, temperature 98.6 F (37 C), temperature source Oral, resp. rate 17, height _0  (1.549 m), weight 58 kg (127 lb 13.9 oz), SpO2 100 %. Physical Exam  Constitutional: She appears well-developed and well-nourished.   HENT:  Head: Normocephalic.  Eyes: Pupils are equal, round, and reactive to light.  Neck: Neck supple. No JVD present. No tracheal deviation present. No thyromegaly present.  Cardiovascular: Normal rate and intact distal pulses.   Respiratory: Effort normal and breath sounds normal. No respiratory distress. She has no wheezes.  GI: Soft. There is no tenderness. There is no guarding.  Musculoskeletal:       Left hip: She exhibits decreased range of motion, decreased strength, tenderness and bony tenderness. She exhibits no swelling, no deformity and no laceration.  Lymphadenopathy:    She has no cervical adenopathy.  Neurological: She is alert. She is disoriented.  Skin: Skin is warm and dry.  Psychiatric: She has a normal mood and affect. Cognition and memory are impaired.      Assessment/Plan:  Subcapital left hip fracture   NPO after midnight Dr. Lyla Glassing is planning surgery tomorrow. He has already placed a order to obtain consent for a left hip hemiarthroplasty.       Pricilla Loveless 06/19/2016, 8:08 AM

## 2016-06-19 NOTE — Progress Notes (Signed)
PROGRESS NOTE    Brittany Chambers  [id number] DOB: 01-14-1919 DOA: 06/18/2016 PCP: Leeroy Cha, MD    Brief Narrative:  Patient is a 82 year old lady history of dementia, recurrent cataracts, gastroesophageal reflux disease, hypothyroidism, failure to thrive who presented to the ED after a fall while using the bathroom. Patient noted to have a left hip fracture. Patient also noted to be significantly hypotensive and in a flutter with RVR. First set of troponins mildly elevated. Patient placed on a Cardizem drip in place in the step down unit. Cardiology consulted for preop clearance and management of A. fib and elevated troponins. Orthopedics following for possible hip repair tomorrow 06/20/2016.   Assessment & Plan:   Principal Problem:   Closed left hip fracture (HCC) Active Problems:   Atrial flutter with rapid ventricular response (HCC)   UTI (urinary tract infection)   Dementia   Hypothyroidism   Hyperglycemia   DM (diabetes mellitus), type 2 ([place])   Fall at home   RBBB   Elevated troponin  #1 closed left hip fracture Likely secondary to mechanical fall patient sustained while going to the bathroom. Patient has been seen in consultation by orthopedics were planning on surgery tomorrow. Patient is likely high risk for surgery due to age, a flutter with RVR noted on admission, now with elevated troponins. 2-D echo ordered however per cardiology OB difficult to obtain good images and very difficult to turn patient without causing significant pain. Per cardiology feels patient has good LV function based on normal blood pressure, heart rate and good pulses and feel not necessary to obtain echo prior to surgery. Cardiology is being consulted for preop clearance. Orthopedics following and anticipate surgery tomorrow.  #2 atrial flutter with RVR CHA2DS2VASC = 4 Patient noted to be in a flutter with RVR on admission and placed on a Cardizem drip currently now with rate  control. Cardiology recommended addition of low-dose beta blocker. Patient not on anticoagulation and patient high risk for falls and a such likely not a good candidate for chronic anticoagulation. Cardiology following and appreciate input and recommendations.  #3 dementia Stable.  #4 urinary tract infection Urine cultures pending. Continue IV Rocephin.  #5 diabetes mellitus type 2 Blood glucose in the 200s. Place on low-dose Lantus 5 units daily. Continue sliding scale insulin.  #6 elevated troponin May be secondary to demand ischemia secondary to fall versus NSTEMI. 2-D echo has been ordered however per cardiology might be difficult to obtain due to patient's current hip fracture and may need to obtain 2-D echo postoperatively. Patient has been started on low-dose beta blocker per cardiology. Continue Cardizem. Cardiology following and appreciate input and recommendations.  #7 hypothyroidism TSH at 5.188. Patient was noted to be taking Synthroid daily except on 2 days during the week. Continue Synthroid on a daily basis and will need repeat thyroid function studies done in about 4-6 weeks.  #8 right bundle branch block  #9 Fall Questionable etiology. Mechanical versus secondary to UTI versus secondary to elevated troponins. Urine cultures pending. Patient on empiric IV antibiotics. Cardiac enzymes being cycled. 2-D echo pending. Cardiology following. PT/OT postoperatively or when okay with orthopedics.    DVT prophylaxis: SCDs Code Status: DO NOT RESUSCITATE Family Communication: Updated patient daughter at bedside. Disposition Plan: Likely skilled nursing facility versus home with home health postoperatively and per orthopedics.   Consultants:   Orthopedics: Dr. Alvan Dame 06/18/2016  Cardiology: Dr. Acie Fredrickson 06/19/2016  Procedures:   Chest x-ray 06/18/2016  Plain films of the left  hip 06/18/2016  Plain. The left tib-fib 06/18/2016  Antimicrobials:   IV Rocephin  06/18/2016   Subjective: Patient laying in bed with incomprehensible speech. Denies CP. No SOB.  Objective: Vitals:   06/19/16 0200 06/19/16 0339 06/19/16 0400 06/19/16 0800  BP: 124/67  120/63   Pulse: 74  75   Resp: 16  17   Temp:  98.6 F (37 C)  98 F (36.7 C)  TempSrc:  Oral  Oral  SpO2: 99%  100%   Weight:      Height:        Intake/Output Summary (Last 24 hours) at 06/19/16 0924 Last data filed at 06/19/16 0600  Gross per 24 hour  Intake          1051.92 ml  Output              100 ml  Net           951.92 ml   Filed Weights   06/18/16 2200  Weight: 58 kg (127 lb 13.9 oz)    Examination:  General exam: Laying turned to right. Respiratory system: Clear to auscultation. Respiratory effort normal. Cardiovascular system: Irregularly irregular. No JVD, murmurs, rubs, gallops or clicks. No pedal edema. Gastrointestinal system: Abdomen is nondistended, soft and nontender. No organomegaly or masses felt. Normal bowel sounds heard. Central nervous system: Alert. Disoriented. No focal neurological deficits. Extremities: L hip TTP. Skin: No rashes, lesions or ulcers Psychiatry: Judgement and insight appear normal. Mood & affect appropriate.     Data Reviewed: I have personally reviewed following labs and imaging studies  CBC:  Recent Labs Lab 06/18/16 1434 06/19/16 0157  WBC 7.4 14.6*  NEUTROABS 5.3  --   HGB 15.8* 15.0  HCT 45.5 41.0  MCV 95.0 92.8  PLT 172 0000000   Basic Metabolic Panel:  Recent Labs Lab 06/18/16 1434 06/19/16 0157  NA 133* 131*  K 4.0 4.4  CL 97* 101  CO2 23 21*  GLUCOSE 219* 236*  BUN 14 16  CREATININE 1.57* 1.46*  CALCIUM 9.7 8.9   GFR: Estimated Creatinine Clearance: 18.9 mL/min (by C-G formula based on SCr of 1.46 mg/dL (H)). Liver Function Tests:  Recent Labs Lab 06/19/16 0157  ALBUMIN 3.9   No results for input(s): LIPASE, AMYLASE in the last 168 hours. No results for input(s): AMMONIA in the last 168  hours. Coagulation Profile:  Recent Labs Lab 06/18/16 1434 06/19/16 0157  INR 0.98 1.03   Cardiac Enzymes:  Recent Labs Lab 06/18/16 1840 06/18/16 2057 06/19/16 0157 06/19/16 0806  TROPONINI <0.03 0.03* 0.09* 0.11*   BNP (last 3 results) No results for input(s): PROBNP in the last 8760 hours. HbA1C: No results for input(s): HGBA1C in the last 72 hours. CBG:  Recent Labs Lab 06/18/16 2108 06/19/16 0001 06/19/16 0339  GLUCAP 289* 231* 189*   Lipid Profile: No results for input(s): CHOL, HDL, LDLCALC, TRIG, CHOLHDL, LDLDIRECT in the last 72 hours. Thyroid Function Tests:  Recent Labs  06/18/16 1649  TSH 5.188*   Anemia Panel: No results for input(s): VITAMINB12, FOLATE, FERRITIN, TIBC, IRON, RETICCTPCT in the last 72 hours. Sepsis Labs:  Recent Labs Lab 06/18/16 1658  LATICACIDVEN 1.75    Recent Results (from the past 240 hour(s))  MRSA PCR Screening     Status: None   Collection Time: 06/18/16  8:48 PM  Result Value Ref Range Status   MRSA by PCR NEGATIVE NEGATIVE Final    Comment:  The GeneXpert MRSA Assay (FDA approved for NASAL specimens only), is one component of a comprehensive MRSA colonization surveillance program. It is not intended to diagnose MRSA infection nor to guide or monitor treatment for MRSA infections.          Radiology Studies: Dg Tibia/fibula Left  Result Date: 06/18/2016 CLINICAL DATA:  Fall, left lower leg pain EXAM: LEFT TIBIA AND FIBULA - 2 VIEW COMPARISON:  None. FINDINGS: No fracture or dislocation is seen. Mild degenerative changes with lateral compartment chondrocalcinosis. Vascular calcifications. IMPRESSION: No acute osseus abnormality is seen. Electronically Signed   By: Julian Hy M.D.   On: 06/18/2016 16:27   Ct Head Wo Contrast  Result Date: 06/18/2016 CLINICAL DATA:  Fall, hit the left side of the head EXAM: CT HEAD WITHOUT CONTRAST CT CERVICAL SPINE WITHOUT CONTRAST TECHNIQUE:  Multidetector CT imaging of the head and cervical spine was performed following the standard protocol without intravenous contrast. Multiplanar CT image reconstructions of the cervical spine were also generated. COMPARISON:  09/04/2015 FINDINGS: CT HEAD FINDINGS Brain: No intracranial hemorrhage, mass effect or midline shift. Stable cerebral atrophy. Stable extensive periventricular and patchy subcortical chronic white matter disease. No acute cortical infarction. No mass lesion is noted on this unenhanced scan. Vascular: Atherosclerotic calcifications of carotid siphon. Skull: No skull fracture is noted. Sinuses/Orbits: No paranasal sinuses air-fluid levels. Other: There is scalp swelling and subcutaneous stranding in left lateral extra orbital region and left temporal/ zygomatic region. CT CERVICAL SPINE FINDINGS Alignment: There is normal alignment. Skull base and vertebrae: No acute fracture or subluxation. Degenerative changes are noted C1-C2 articulation. There is mild anterior and mild posterior spurring at C4-C5 and C5-C6 and C6-C7 level. Mild posterior spurring at C7-T1 level. Soft tissues and spinal canal: No prevertebral soft tissue swelling. Mild spinal canal stenosis due to posterior spurring at C4-C5 and C5-C6 level. Disc levels: Mild disc space flattening at C4-C5, C5-C6 and C6-C7 level. Upper chest: There is no pneumothorax in visualized lung apices. Other: Atherosclerotic calcifications of vertebral arteries are noted. Extensive atherosclerotic calcifications bilateral carotid bifurcation. IMPRESSION: 1. No acute intracranial abnormality. 2. Stable atrophy and extensive chronic white matter disease. No definite acute cortical infarction. 3. There is scalp swelling and subcutaneous stranding left lateral extra orbital region and left temporal/zygomatic region. Clinical correlation is necessary. 4. No cervical spine acute fracture or subluxation. Multilevel degenerative changes as described above.  Electronically Signed   By: Lahoma Crocker M.D.   On: 06/18/2016 15:22   Ct Cervical Spine Wo Contrast  Result Date: 06/18/2016 CLINICAL DATA:  Fall, hit the left side of the head EXAM: CT HEAD WITHOUT CONTRAST CT CERVICAL SPINE WITHOUT CONTRAST TECHNIQUE: Multidetector CT imaging of the head and cervical spine was performed following the standard protocol without intravenous contrast. Multiplanar CT image reconstructions of the cervical spine were also generated. COMPARISON:  09/04/2015 FINDINGS: CT HEAD FINDINGS Brain: No intracranial hemorrhage, mass effect or midline shift. Stable cerebral atrophy. Stable extensive periventricular and patchy subcortical chronic white matter disease. No acute cortical infarction. No mass lesion is noted on this unenhanced scan. Vascular: Atherosclerotic calcifications of carotid siphon. Skull: No skull fracture is noted. Sinuses/Orbits: No paranasal sinuses air-fluid levels. Other: There is scalp swelling and subcutaneous stranding in left lateral extra orbital region and left temporal/ zygomatic region. CT CERVICAL SPINE FINDINGS Alignment: There is normal alignment. Skull base and vertebrae: No acute fracture or subluxation. Degenerative changes are noted C1-C2 articulation. There is mild anterior and mild posterior spurring at  C4-C5 and C5-C6 and C6-C7 level. Mild posterior spurring at C7-T1 level. Soft tissues and spinal canal: No prevertebral soft tissue swelling. Mild spinal canal stenosis due to posterior spurring at C4-C5 and C5-C6 level. Disc levels: Mild disc space flattening at C4-C5, C5-C6 and C6-C7 level. Upper chest: There is no pneumothorax in visualized lung apices. Other: Atherosclerotic calcifications of vertebral arteries are noted. Extensive atherosclerotic calcifications bilateral carotid bifurcation. IMPRESSION: 1. No acute intracranial abnormality. 2. Stable atrophy and extensive chronic white matter disease. No definite acute cortical infarction. 3. There  is scalp swelling and subcutaneous stranding left lateral extra orbital region and left temporal/zygomatic region. Clinical correlation is necessary. 4. No cervical spine acute fracture or subluxation. Multilevel degenerative changes as described above. Electronically Signed   By: Lahoma Crocker M.D.   On: 06/18/2016 15:22   Dg Chest Portable 1 View  Result Date: 06/18/2016 CLINICAL DATA:  Hip fracture.  Dementia EXAM: PORTABLE CHEST 1 VIEW COMPARISON:  03/11/2015 FINDINGS: Chronic borderline cardiomegaly, accentuated by technique and rotation. Diffuse atherosclerotic calcification of the aorta. There is no edema, consolidation, effusion, or pneumothorax. Osteopenia. No acute osseous finding. IMPRESSION: No evidence of active disease. Electronically Signed   By: Monte Fantasia M.D.   On: 06/18/2016 16:44   Dg Hip Unilat With Pelvis 2-3 Views Left  Result Date: 06/18/2016 CLINICAL DATA:  Fall, left hip pain EXAM: DG HIP (WITH OR WITHOUT PELVIS) 2-3V LEFT COMPARISON:  None. FINDINGS: Subcapital left hip fracture. Visualized bony pelvis appears intact. Bilobed hip joint spaces are preserved. IMPRESSION: Subcapital left hip fracture. Electronically Signed   By: Julian Hy M.D.   On: 06/18/2016 16:09        Scheduled Meds: . ALPRAZolam  1 mg Oral QHS  . cefTRIAXone (ROCEPHIN)  IV  1 g Intravenous Q24H  . famotidine  10 mg Oral Daily  . insulin aspart  0-9 Units Subcutaneous Q4H  . insulin glargine  5 Units Subcutaneous Daily  . levothyroxine  100 mcg Oral Once per day on Sun Tue Thu Fri Sat  . loratadine  10 mg Oral Daily  . traMADol-acetaminophen  1 tablet Oral BID   Continuous Infusions: . diltiazem (CARDIZEM) infusion 5 mg/hr (06/18/16 2137)     LOS: 1 day    Time spent: 60 mins    Yoshiko Keleher, MD [facility] Pager [phone]  If 7PM-7AM, please contact night-coverage [email or web address] Password [facility] 06/19/2016, 9:24 AM

## 2016-06-19 NOTE — Progress Notes (Addendum)
CRITICAL VALUE ALERT  Critical value received: troponin 0.09  Date of notification:  06/19/16  Time of notification:  0157  Critical value read back:Yes.    Nurse who received alert:  Reche Dixon  MD notified (1st page):  Raliegh Ip Baltazar Najjar  Time of first page:  0210  MD notified (2nd page):  Time of second page:  Responding MD:  Tylene Fantasia  Time MD responded:  [phone]

## 2016-06-19 NOTE — Consult Note (Signed)
Reason for Consult:   Atrial flutter with RVR, pre op clearance  Requesting Physician: [facility] Cardiologist New  HPI:   82 y.o. female with medical history significant of dementia, GERD, hypothyroidism, diet controlled DM, chronic UTIs,and failure to thrive.  I was able to discuss the case with the patient's daughter.   She lives at home with her daughter and was admitted 06/18/16 after she fell in the bathroom and suffered a fractured Lt hip. On arrival to the her blood pressure elevated 199/158 heart rate up to 148 admission. EKG shows atrial flutter with 2:1 conduction and RBBB. Her HR is under better control with IV Diltiazem but she remains in atrial flutter. Her B/P has come down from adm as well. There is no prior history of AF, HTN, or CAD.   PMHx:  Past Medical History:  Diagnosis Date  . Dementia   . GERD (gastroesophageal reflux disease)   . Hypothyroidism   . Macular degeneration bilateral    Past Surgical History:  Procedure Laterality Date  . ABDOMINAL HYSTERECTOMY    . CHOLECYSTECTOMY    . EYE SURGERY      SOCHx:  reports that she has never smoked. She has never used smokeless tobacco. She reports that she drinks alcohol. She reports that she does not use drugs.  FAMHx: Family History  Problem Relation Age of Onset  . CAD Mother   . CAD Father   . Stroke Neg Hx   . Diabetes Neg Hx     ALLERGIES: No Known Allergies  ROS: Review of Systems: We are unable to obtain review of systems because of the patient's dementia. She was unable to answer questions. The patient's daughter was with [place] in the room. She stated that she really did not have any other medical complaints. She confirmed that the patient had fairly severe dementia. The patient clearly has left hip pain.   Pertinent positives include: hip pain  .    HOME MEDICATIONS: Prior to Admission medications   Medication Sig Start Date End Date Taking? Authorizing Provider   ALPRAZolam Duanne Moron) 1 MG tablet Take 1 mg by mouth at bedtime.    Yes Historical Provider, MD  levothyroxine (SYNTHROID, LEVOTHROID) 100 MCG tablet Take 100 mcg by mouth as directed. takes 5 days a weej; except on Mondays or Wednesdays   Yes Historical Provider, MD  loratadine (CLARITIN) 10 MG tablet Take 10 mg by mouth daily.   Yes Historical Provider, MD  Melatonin 3 MG TABS Take 3 mg by mouth at bedtime.   Yes Historical Provider, MD  ranitidine (ZANTAC) 150 MG tablet Take 150 mg by mouth 2 (two) times daily.    Yes Historical Provider, MD  traMADol-acetaminophen (ULTRACET) 37.5-325 MG tablet Take 1 tablet by mouth 2 (two) times daily. 05/28/16  Yes Historical Provider, MD  trimethoprim (TRIMPEX) 100 MG tablet Take 100 mg by mouth daily. continuous 05/09/16  Yes Historical Provider, MD  Vitamin D, Cholecalciferol, 1000 units CAPS Take 1,000 Units by mouth daily.    Yes Historical Provider, MD    HOSPITAL MEDICATIONS: I have reviewed the patient's current medications.  VITALS: Blood pressure 120/63, pulse 75, temperature 98.6 F (37 C), temperature source Oral, resp. rate 17, height 5\' 1"  (1.549 m), weight 127 lb 13.9 oz (58 kg), SpO2 100 %.  PHYSICAL EXAM: Physical Exam: Blood pressure 120/63, pulse 75, temperature 98 F (36.7 C), temperature source Oral, resp. rate 17, height 5\' 1"  (  1.549 m), weight 127 lb 13.9 oz (58 kg), SpO2 100 %. General:  Elderly female ,Appears chronically ill. She is moaning in bed. She is lying on her right side.  Head: She has a large bruise on her left temple. She has various abrasions.  Neck:  JVD not elevated.  Lungs: Lungs were clear posteriorly.  Heart: Irregularly irregular.  Abdomen: Soft, non-tender, non-distended with normoactive bowel sounds. No hepatomegaly. No rebound/guarding. No obvious abdominal masses.  Msk:  Strength and tone appear normal for age.  Extremities: No clubbing or cyanosis. No edema.  Distal pedal pulses are 2+ and equal  bilaterally.  Neuro: Unable to assess. The patient was moaning in bed. She would not respond to questions.  Psych:  The patient is demented.   LABS: Results for orders placed or performed during the hospital encounter of 06/18/16 (from the past 24 hour(s))  Urinalysis, Routine w reflex microscopic     Status: Abnormal   Collection Time: 06/18/16  2:10 PM  Result Value Ref Range   Color, Urine YELLOW YELLOW   APPearance HAZY (A) CLEAR   Specific Gravity, Urine 1.013 1.005 - 1.030   pH 5.0 5.0 - 8.0   Glucose, UA NEGATIVE NEGATIVE mg/dL   Hgb urine dipstick SMALL (A) NEGATIVE   Bilirubin Urine NEGATIVE NEGATIVE   Ketones, ur NEGATIVE NEGATIVE mg/dL   Protein, ur NEGATIVE NEGATIVE mg/dL   Nitrite POSITIVE (A) NEGATIVE   Leukocytes, UA LARGE (A) NEGATIVE   RBC / HPF 0-5 0 - 5 RBC/hpf   WBC, UA 6-30 0 - 5 WBC/hpf   Bacteria, UA RARE (A) NONE SEEN   Squamous Epithelial / LPF NONE SEEN NONE SEEN   Mucous PRESENT    Hyaline Casts, UA PRESENT   Type and screen [place]     Status: None   Collection Time: 06/18/16  2:32 PM  Result Value Ref Range   ABO/RH(D) B NEG    Antibody Screen NEG    Sample Expiration 06/21/2016   ABO/Rh     Status: None   Collection Time: 06/18/16  2:32 PM  Result Value Ref Range   ABO/RH(D) B NEG   Basic metabolic panel     Status: Abnormal   Collection Time: 06/18/16  2:34 PM  Result Value Ref Range   Sodium 133 (L) 135 - 145 mmol/L   Potassium 4.0 3.5 - 5.1 mmol/L   Chloride 97 (L) 101 - 111 mmol/L   CO2 23 22 - 32 mmol/L   Glucose, Bld 219 (H) 65 - 99 mg/dL   BUN 14 6 - 20 mg/dL   Creatinine, Ser 1.57 (H) 0.44 - 1.00 mg/dL   Calcium 9.7 8.9 - 10.3 mg/dL   GFR calc non Af Amer 27 (L) >60 mL/min   GFR calc Af Amer 31 (L) >60 mL/min   Anion gap 13 5 - 15  CBC WITH DIFFERENTIAL     Status: Abnormal   Collection Time: 06/18/16  2:34 PM  Result Value Ref Range   WBC 7.4 4.0 - 10.5 K/uL   RBC 4.79 3.87 - 5.11 MIL/uL    Hemoglobin 15.8 (H) 12.0 - 15.0 g/dL   HCT 45.5 36.0 - 46.0 %   MCV 95.0 78.0 - 100.0 fL   MCH 33.0 26.0 - 34.0 pg   MCHC 34.7 30.0 - 36.0 g/dL   RDW 13.3 11.5 - 15.5 %   Platelets 172 150 - 400 K/uL   Neutrophils Relative % 72 %  Neutro Abs 5.3 1.7 - 7.7 K/uL   Lymphocytes Relative 17 %   Lymphs Abs 1.3 0.7 - 4.0 K/uL   Monocytes Relative 8 %   Monocytes Absolute 0.6 0.1 - 1.0 K/uL   Eosinophils Relative 3 %   Eosinophils Absolute 0.2 0.0 - 0.7 K/uL   Basophils Relative 0 %   Basophils Absolute 0.0 0.0 - 0.1 K/uL  Protime-INR     Status: None   Collection Time: 06/18/16  2:34 PM  Result Value Ref Range   Prothrombin Time 13.0 11.4 - 15.2 seconds   INR 0.98   TSH     Status: Abnormal   Collection Time: 06/18/16  4:49 PM  Result Value Ref Range   TSH 5.188 (H) 0.350 - 4.500 uIU/mL  I-Stat CG4 Lactic Acid, ED     Status: None   Collection Time: 06/18/16  4:58 PM  Result Value Ref Range   Lactic Acid, Venous 1.75 0.5 - 1.9 mmol/L  Troponin I     Status: None   Collection Time: 06/18/16  6:40 PM  Result Value Ref Range   Troponin I <0.03 <0.03 ng/mL  MRSA PCR Screening     Status: None   Collection Time: 06/18/16  8:48 PM  Result Value Ref Range   MRSA by PCR NEGATIVE NEGATIVE  Troponin I     Status: Abnormal   Collection Time: 06/18/16  8:57 PM  Result Value Ref Range   Troponin I 0.03 (HH) <0.03 ng/mL  Glucose, capillary     Status: Abnormal   Collection Time: 06/19/16 12:01 AM  Result Value Ref Range   Glucose-Capillary 231 (H) 65 - 99 mg/dL  CBC     Status: Abnormal   Collection Time: 06/19/16  1:57 AM  Result Value Ref Range   WBC 14.6 (H) 4.0 - 10.5 K/uL   RBC 4.42 3.87 - 5.11 MIL/uL   Hemoglobin 15.0 12.0 - 15.0 g/dL   HCT 41.0 36.0 - 46.0 %   MCV 92.8 78.0 - 100.0 fL   MCH 33.9 26.0 - 34.0 pg   MCHC 36.6 (H) 30.0 - 36.0 g/dL   RDW 13.5 11.5 - 15.5 %   Platelets 158 150 - 400 K/uL  Basic metabolic panel     Status: Abnormal   Collection Time: 06/19/16   1:57 AM  Result Value Ref Range   Sodium 131 (L) 135 - 145 mmol/L   Potassium 4.4 3.5 - 5.1 mmol/L   Chloride 101 101 - 111 mmol/L   CO2 21 (L) 22 - 32 mmol/L   Glucose, Bld 236 (H) 65 - 99 mg/dL   BUN 16 6 - 20 mg/dL   Creatinine, Ser 1.46 (H) 0.44 - 1.00 mg/dL   Calcium 8.9 8.9 - 10.3 mg/dL   GFR calc non Af Amer 29 (L) >60 mL/min   GFR calc Af Amer 34 (L) >60 mL/min   Anion gap 9 5 - 15  Troponin I     Status: Abnormal   Collection Time: 06/19/16  1:57 AM  Result Value Ref Range   Troponin I 0.09 (HH) <0.03 ng/mL  Protime-INR     Status: None   Collection Time: 06/19/16  1:57 AM  Result Value Ref Range   Prothrombin Time 13.5 11.4 - 15.2 seconds   INR 1.03   Albumin     Status: None   Collection Time: 06/19/16  1:57 AM  Result Value Ref Range   Albumin 3.9 3.5 - 5.0 g/dL  Glucose,  capillary     Status: Abnormal   Collection Time: 06/19/16  3:39 AM  Result Value Ref Range   Glucose-Capillary 189 (H) 65 - 99 mg/dL   Comment 1 Notify RN     EKG: Atrial flutter with variable VR- 115, RBBB  IMAGING: Dg Tibia/fibula Left  Result Date: 06/18/2016 CLINICAL DATA:  Fall, left lower leg pain EXAM: LEFT TIBIA AND FIBULA - 2 VIEW COMPARISON:  None. FINDINGS: No fracture or dislocation is seen. Mild degenerative changes with lateral compartment chondrocalcinosis. Vascular calcifications. IMPRESSION: No acute osseus abnormality is seen. Electronically Signed   By: Julian Hy M.D.   On: 06/18/2016 16:27   Ct Head Wo Contrast  Result Date: 06/18/2016 CLINICAL DATA:  Fall, hit the left side of the head EXAM: CT HEAD WITHOUT CONTRAST CT CERVICAL SPINE WITHOUT CONTRAST TECHNIQUE: Multidetector CT imaging of the head and cervical spine was performed following the standard protocol without intravenous contrast. Multiplanar CT image reconstructions of the cervical spine were also generated. COMPARISON:  09/04/2015 FINDINGS: CT HEAD FINDINGS Brain: No intracranial hemorrhage, mass  effect or midline shift. Stable cerebral atrophy. Stable extensive periventricular and patchy subcortical chronic white matter disease. No acute cortical infarction. No mass lesion is noted on this unenhanced scan. Vascular: Atherosclerotic calcifications of carotid siphon. Skull: No skull fracture is noted. Sinuses/Orbits: No paranasal sinuses air-fluid levels. Other: There is scalp swelling and subcutaneous stranding in left lateral extra orbital region and left temporal/ zygomatic region. CT CERVICAL SPINE FINDINGS Alignment: There is normal alignment. Skull base and vertebrae: No acute fracture or subluxation. Degenerative changes are noted C1-C2 articulation. There is mild anterior and mild posterior spurring at C4-C5 and C5-C6 and C6-C7 level. Mild posterior spurring at C7-T1 level. Soft tissues and spinal canal: No prevertebral soft tissue swelling. Mild spinal canal stenosis due to posterior spurring at C4-C5 and C5-C6 level. Disc levels: Mild disc space flattening at C4-C5, C5-C6 and C6-C7 level. Upper chest: There is no pneumothorax in visualized lung apices. Other: Atherosclerotic calcifications of vertebral arteries are noted. Extensive atherosclerotic calcifications bilateral carotid bifurcation. IMPRESSION: 1. No acute intracranial abnormality. 2. Stable atrophy and extensive chronic white matter disease. No definite acute cortical infarction. 3. There is scalp swelling and subcutaneous stranding left lateral extra orbital region and left temporal/zygomatic region. Clinical correlation is necessary. 4. No cervical spine acute fracture or subluxation. Multilevel degenerative changes as described above. Electronically Signed   By: Lahoma Crocker M.D.   On: 06/18/2016 15:22   Ct Cervical Spine Wo Contrast  Result Date: 06/18/2016 CLINICAL DATA:  Fall, hit the left side of the head EXAM: CT HEAD WITHOUT CONTRAST CT CERVICAL SPINE WITHOUT CONTRAST TECHNIQUE: Multidetector CT imaging of the head and  cervical spine was performed following the standard protocol without intravenous contrast. Multiplanar CT image reconstructions of the cervical spine were also generated. COMPARISON:  09/04/2015 FINDINGS: CT HEAD FINDINGS Brain: No intracranial hemorrhage, mass effect or midline shift. Stable cerebral atrophy. Stable extensive periventricular and patchy subcortical chronic white matter disease. No acute cortical infarction. No mass lesion is noted on this unenhanced scan. Vascular: Atherosclerotic calcifications of carotid siphon. Skull: No skull fracture is noted. Sinuses/Orbits: No paranasal sinuses air-fluid levels. Other: There is scalp swelling and subcutaneous stranding in left lateral extra orbital region and left temporal/ zygomatic region. CT CERVICAL SPINE FINDINGS Alignment: There is normal alignment. Skull base and vertebrae: No acute fracture or subluxation. Degenerative changes are noted C1-C2 articulation. There is mild anterior and mild posterior spurring  at C4-C5 and C5-C6 and C6-C7 level. Mild posterior spurring at C7-T1 level. Soft tissues and spinal canal: No prevertebral soft tissue swelling. Mild spinal canal stenosis due to posterior spurring at C4-C5 and C5-C6 level. Disc levels: Mild disc space flattening at C4-C5, C5-C6 and C6-C7 level. Upper chest: There is no pneumothorax in visualized lung apices. Other: Atherosclerotic calcifications of vertebral arteries are noted. Extensive atherosclerotic calcifications bilateral carotid bifurcation. IMPRESSION: 1. No acute intracranial abnormality. 2. Stable atrophy and extensive chronic white matter disease. No definite acute cortical infarction. 3. There is scalp swelling and subcutaneous stranding left lateral extra orbital region and left temporal/zygomatic region. Clinical correlation is necessary. 4. No cervical spine acute fracture or subluxation. Multilevel degenerative changes as described above. Electronically Signed   By: Lahoma Crocker M.D.    On: 06/18/2016 15:22   Dg Chest Portable 1 View  Result Date: 06/18/2016 CLINICAL DATA:  Hip fracture.  Dementia EXAM: PORTABLE CHEST 1 VIEW COMPARISON:  03/11/2015 FINDINGS: Chronic borderline cardiomegaly, accentuated by technique and rotation. Diffuse atherosclerotic calcification of the aorta. There is no edema, consolidation, effusion, or pneumothorax. Osteopenia. No acute osseous finding. IMPRESSION: No evidence of active disease. Electronically Signed   By: Monte Fantasia M.D.   On: 06/18/2016 16:44   Dg Hip Unilat With Pelvis 2-3 Views Left  Result Date: 06/18/2016 CLINICAL DATA:  Fall, left hip pain EXAM: DG HIP (WITH OR WITHOUT PELVIS) 2-3V LEFT COMPARISON:  None. FINDINGS: Subcapital left hip fracture. Visualized bony pelvis appears intact. Bilobed hip joint spaces are preserved. IMPRESSION: Subcapital left hip fracture. Electronically Signed   By: Julian Hy M.D.   On: 06/18/2016 16:09    IMPRESSION: Principal Problem:   Fall at home  Active Problems:   Closed left hip fracture- Lt hip fracture will require surgery scheduled for tomorrow.   The patient is at moderate to high risk for her hip surgery but at this point I do not think we have any other options. Fortunately, her heart rate and blood pressure are well controlled.  An echocardiogram has been ordered but it will be very difficult to obtain good images. She fractured her left hip and is lying on her right side. It will be very difficult to turn her without causing significant pain.  She has good pulses and a normal blood pressure and heart rate. I suspect that her LV function is reasonable. I do not think we necessarily need an echo cardiac gram before proceeding with surgery.    Atrial flutter with rapid ventricular response-Unknown duration. RVR on admission- now rate controlled with Diltiazem, consider adding low dose beta blocker as well.  CHADs VASc=4 for sex, age, DM. Currently not  anticoagulated CHADS2VASC of  At least 3 ( female, age 24),   I discussed chronic anticoagulation with the daughter. We both agree that the patient is at high risk for falls and may not be a good candidate for chronic anti-coagulation. We can assess this further following her surgery.     Dementia-She has been living at home with family    DM (diabetes mellitus), type 2- diet controlled per family, BS high on adm    UTI (urinary tract infection)-chronic antibiotics    Hypothyroidism-TSH 5.18    Hyperglycemia    RBBB    Mertie Moores, MD  06/19/2016 9:19 AM    [facility] [address]  [place], [place]  [address] Pager [phone] Phone: [phone]; Fax: [phone]

## 2016-06-19 NOTE — Progress Notes (Signed)
CRITICAL VALUE ALERT  Critical value received:  Troponin 0.03  Date of notification: 06/18/16  Time of notification:  2057  Critical value read back:Yes.    Nurse who received alert:  Reche Dixon  MD notified (1st page):  Raliegh Ip Baltazar Najjar  Time of first page:  2100  MD notified (2nd page):  Time of second page:  Responding MD:  Tylene Fantasia  Time MD responded:  2100

## 2016-06-20 ENCOUNTER — Inpatient Hospital Stay (HOSPITAL_COMMUNITY): Payer: Commercial Managed Care - HMO | Admitting: Anesthesiology

## 2016-06-20 ENCOUNTER — Encounter (HOSPITAL_COMMUNITY)
Admission: EM | Disposition: A | Discharge status: Skilled Nursing Facility | Payer: Self-pay | Source: Home / Self Care | Attending: Internal Medicine

## 2016-06-20 ENCOUNTER — Inpatient Hospital Stay (HOSPITAL_COMMUNITY): Payer: Commercial Managed Care - HMO

## 2016-06-20 DIAGNOSIS — E039 Hypothyroidism, unspecified: Secondary | ICD-10-CM

## 2016-06-20 DIAGNOSIS — S72002A Fracture of unspecified part of neck of left femur, initial encounter for closed fracture: Secondary | ICD-10-CM | POA: Diagnosis present

## 2016-06-20 HISTORY — PX: ANTERIOR APPROACH HEMI HIP ARTHROPLASTY: SHX6690

## 2016-06-20 LAB — CBC WITH DIFFERENTIAL/PLATELET
BASOS PCT: 0 %
Basophils Absolute: 0 10*3/uL (ref 0.0–0.1)
EOS ABS: 0 10*3/uL (ref 0.0–0.7)
EOS PCT: 0 %
HCT: 40.2 % (ref 36.0–46.0)
HEMOGLOBIN: 14.3 g/dL (ref 12.0–15.0)
LYMPHS ABS: 0.9 10*3/uL (ref 0.7–4.0)
Lymphocytes Relative: 7 %
MCH: 34 pg (ref 26.0–34.0)
MCHC: 35.6 g/dL (ref 30.0–36.0)
MCV: 95.7 fL (ref 78.0–100.0)
MONO ABS: 0.8 10*3/uL (ref 0.1–1.0)
MONOS PCT: 6 %
Neutro Abs: 11.4 10*3/uL — ABNORMAL HIGH (ref 1.7–7.7)
Neutrophils Relative %: 87 %
Platelets: 174 10*3/uL (ref 150–400)
RBC: 4.2 MIL/uL (ref 3.87–5.11)
RDW: 13.6 % (ref 11.5–15.5)
WBC: 13.2 10*3/uL — ABNORMAL HIGH (ref 4.0–10.5)

## 2016-06-20 LAB — BASIC METABOLIC PANEL
Anion gap: 10 (ref 5–15)
BUN: 16 mg/dL (ref 6–20)
CALCIUM: 9.3 mg/dL (ref 8.9–10.3)
CHLORIDE: 104 mmol/L (ref 101–111)
CO2: 20 mmol/L — AB (ref 22–32)
CREATININE: 1.19 mg/dL — AB (ref 0.44–1.00)
GFR calc non Af Amer: 38 mL/min — ABNORMAL LOW (ref >60)
GFR, EST AFRICAN AMERICAN: 44 mL/min — AB (ref >60)
GLUCOSE: 191 mg/dL — AB (ref 65–99)
Potassium: 4.4 mmol/L (ref 3.5–5.1)
Sodium: 134 mmol/L — ABNORMAL LOW (ref 135–145)

## 2016-06-20 LAB — GLUCOSE, CAPILLARY
GLUCOSE-CAPILLARY: 156 mg/dL — AB (ref 65–99)
GLUCOSE-CAPILLARY: 182 mg/dL — AB (ref 65–99)
Glucose-Capillary: 109 mg/dL — ABNORMAL HIGH (ref 65–99)
Glucose-Capillary: 140 mg/dL — ABNORMAL HIGH (ref 65–99)
Glucose-Capillary: 166 mg/dL — ABNORMAL HIGH (ref 65–99)
Glucose-Capillary: 181 mg/dL — ABNORMAL HIGH (ref 65–99)
Glucose-Capillary: 184 mg/dL — ABNORMAL HIGH (ref 65–99)
Glucose-Capillary: 252 mg/dL — ABNORMAL HIGH (ref 65–99)

## 2016-06-20 LAB — VITAMIN D 25 HYDROXY (VIT D DEFICIENCY, FRACTURES): Vit D, 25-Hydroxy: 42.1 ng/mL (ref 30.0–100.0)

## 2016-06-20 LAB — HEMOGLOBIN A1C
Hgb A1c MFr Bld: 6.5 % — ABNORMAL HIGH (ref 4.8–5.6)
Mean Plasma Glucose: 140 mg/dL

## 2016-06-20 SURGERY — HEMIARTHROPLASTY, HIP, DIRECT ANTERIOR APPROACH, FOR FRACTURE
Anesthesia: General | Site: Hip | Laterality: Left

## 2016-06-20 MED ORDER — LIDOCAINE 2% (20 MG/ML) 5 ML SYRINGE
INTRAMUSCULAR | Status: AC
  Filled 2016-06-20: qty 5

## 2016-06-20 MED ORDER — PHENOL 1.4 % MT LIQD: 1.0000 | OROMUCOSAL | Status: DC | PRN

## 2016-06-20 MED ORDER — POVIDONE-IODINE 10 % EX SWAB
2.0000 "application " | Freq: Once | CUTANEOUS | Status: AC
  Administered 2016-06-20: 2 via TOPICAL

## 2016-06-20 MED ORDER — SODIUM CHLORIDE 0.9 % IV SOLN
INTRAVENOUS | Status: DC
  Administered 2016-06-21: 01:00:00 via INTRAVENOUS

## 2016-06-20 MED ORDER — VITAMIN D 1000 UNITS PO TABS
1000.0000 [IU] | ORAL_TABLET | Freq: Every day | ORAL | Status: DC
  Administered 2016-06-20 – 2016-06-25 (×6): 1000 [IU] via ORAL
  Filled 2016-06-20 (×6): qty 1

## 2016-06-20 MED ORDER — SODIUM CHLORIDE 0.9 % IV BOLUS (SEPSIS)
500.0000 mL | Freq: Once | INTRAVENOUS | Status: AC
  Administered 2016-06-20: 500 mL via INTRAVENOUS

## 2016-06-20 MED ORDER — CEFAZOLIN SODIUM-DEXTROSE 2-4 GM/100ML-% IV SOLN
INTRAVENOUS | Status: AC
  Filled 2016-06-20: qty 100

## 2016-06-20 MED ORDER — ONDANSETRON HCL 4 MG PO TABS: 4.0000 mg | ORAL_TABLET | Freq: Four times a day (QID) | ORAL | Status: DC | PRN

## 2016-06-20 MED ORDER — ISOPROPYL ALCOHOL 70 % SOLN
Status: DC | PRN
  Administered 2016-06-20: 1 via TOPICAL

## 2016-06-20 MED ORDER — LIDOCAINE 2% (20 MG/ML) 5 ML SYRINGE
INTRAMUSCULAR | Status: DC | PRN
  Administered 2016-06-20: 100 mg via INTRAVENOUS

## 2016-06-20 MED ORDER — KETOROLAC TROMETHAMINE 30 MG/ML IJ SOLN
INTRAMUSCULAR | Status: AC
  Filled 2016-06-20: qty 1

## 2016-06-20 MED ORDER — PHENYLEPHRINE HCL 10 MG/ML IJ SOLN
INTRAMUSCULAR | Status: AC
  Filled 2016-06-20: qty 2

## 2016-06-20 MED ORDER — ACETAMINOPHEN 325 MG PO TABS: 650.0000 mg | ORAL_TABLET | Freq: Four times a day (QID) | ORAL | Status: DC | PRN

## 2016-06-20 MED ORDER — SODIUM CHLORIDE 0.9 % IR SOLN
Status: DC | PRN
  Administered 2016-06-20: 3000 mL

## 2016-06-20 MED ORDER — PHENYLEPHRINE 40 MCG/ML (10ML) SYRINGE FOR IV PUSH (FOR BLOOD PRESSURE SUPPORT)
PREFILLED_SYRINGE | INTRAVENOUS | Status: AC
  Filled 2016-06-20: qty 10

## 2016-06-20 MED ORDER — ACETAMINOPHEN 650 MG RE SUPP: 650.0000 mg | Freq: Four times a day (QID) | RECTAL | Status: DC | PRN

## 2016-06-20 MED ORDER — CEFAZOLIN SODIUM-DEXTROSE 2-4 GM/100ML-% IV SOLN
2.0000 g | Freq: Four times a day (QID) | INTRAVENOUS | Status: AC
  Administered 2016-06-20 (×2): 2 g via INTRAVENOUS
  Filled 2016-06-20 (×3): qty 100

## 2016-06-20 MED ORDER — METOCLOPRAMIDE HCL 5 MG/ML IJ SOLN: 10.0000 mg | Freq: Once | INTRAMUSCULAR | Status: DC | PRN

## 2016-06-20 MED ORDER — SODIUM CHLORIDE 0.9 % IV SOLN
1000.0000 mg | INTRAVENOUS | Status: AC
  Administered 2016-06-20: 1000 mg via INTRAVENOUS
  Filled 2016-06-20: qty 10

## 2016-06-20 MED ORDER — BUPIVACAINE HCL (PF) 0.5 % IJ SOLN
INTRAMUSCULAR | Status: AC
  Filled 2016-06-20: qty 30

## 2016-06-20 MED ORDER — PHENYLEPHRINE HCL 10 MG/ML IJ SOLN
INTRAMUSCULAR | Status: DC | PRN
  Administered 2016-06-20: 80 ug via INTRAVENOUS
  Administered 2016-06-20: 40 ug via INTRAVENOUS

## 2016-06-20 MED ORDER — METOCLOPRAMIDE HCL 5 MG PO TABS: 5.0000 mg | ORAL_TABLET | Freq: Three times a day (TID) | ORAL | Status: DC | PRN

## 2016-06-20 MED ORDER — SODIUM CHLORIDE 0.9 % IJ SOLN
INTRAMUSCULAR | Status: AC
  Filled 2016-06-20: qty 50

## 2016-06-20 MED ORDER — CHLORHEXIDINE GLUCONATE 4 % EX LIQD
60.0000 mL | Freq: Once | CUTANEOUS | Status: DC
  Filled 2016-06-20: qty 60

## 2016-06-20 MED ORDER — DEXTROSE 5 % IV SOLN
INTRAVENOUS | Status: DC | PRN
  Administered 2016-06-20: 25 ug/min via INTRAVENOUS

## 2016-06-20 MED ORDER — KETOROLAC TROMETHAMINE 30 MG/ML IJ SOLN
INTRAMUSCULAR | Status: DC | PRN
  Administered 2016-06-20: 30 mg via INTRAMUSCULAR

## 2016-06-20 MED ORDER — ONDANSETRON HCL 4 MG/2ML IJ SOLN
INTRAMUSCULAR | Status: DC | PRN
  Administered 2016-06-20: 4 mg via INTRAVENOUS

## 2016-06-20 MED ORDER — CEFAZOLIN SODIUM-DEXTROSE 2-3 GM-% IV SOLR
INTRAVENOUS | Status: DC | PRN
  Administered 2016-06-20: 2 g via INTRAVENOUS

## 2016-06-20 MED ORDER — FENTANYL CITRATE (PF) 100 MCG/2ML IJ SOLN
INTRAMUSCULAR | Status: DC | PRN
  Administered 2016-06-20: 25 ug via INTRAVENOUS

## 2016-06-20 MED ORDER — WATER FOR IRRIGATION, STERILE IR SOLN
Status: DC | PRN
  Administered 2016-06-20: 1000 mL via SURGICAL_CAVITY

## 2016-06-20 MED ORDER — LACTATED RINGERS IV SOLN
INTRAVENOUS | Status: DC | PRN
  Administered 2016-06-20: 08:00:00 via INTRAVENOUS

## 2016-06-20 MED ORDER — METOCLOPRAMIDE HCL 5 MG/ML IJ SOLN: 5.0000 mg | Freq: Three times a day (TID) | INTRAMUSCULAR | Status: DC | PRN

## 2016-06-20 MED ORDER — ACETAMINOPHEN 10 MG/ML IV SOLN
INTRAVENOUS | Status: AC
  Filled 2016-06-20: qty 100

## 2016-06-20 MED ORDER — ACETAMINOPHEN 10 MG/ML IV SOLN
1000.0000 mg | INTRAVENOUS | Status: AC
  Administered 2016-06-20: 1000 mg via INTRAVENOUS

## 2016-06-20 MED ORDER — FENTANYL CITRATE (PF) 100 MCG/2ML IJ SOLN
INTRAMUSCULAR | Status: AC
  Filled 2016-06-20: qty 2

## 2016-06-20 MED ORDER — SODIUM CHLORIDE 0.9 % IJ SOLN
INTRAMUSCULAR | Status: DC | PRN
  Administered 2016-06-20: 30 mL

## 2016-06-20 MED ORDER — BUPIVACAINE HCL (PF) 0.5 % IJ SOLN
INTRAMUSCULAR | Status: DC | PRN
  Administered 2016-06-20: 3 mL via INTRATHECAL

## 2016-06-20 MED ORDER — MENTHOL 3 MG MT LOZG: 1.0000 | LOZENGE | OROMUCOSAL | Status: DC | PRN

## 2016-06-20 MED ORDER — HYDROGEN PEROXIDE 3 % EX SOLN
CUTANEOUS | Status: AC
  Filled 2016-06-20: qty 473

## 2016-06-20 MED ORDER — FENTANYL CITRATE (PF) 100 MCG/2ML IJ SOLN: 25.0000 ug | INTRAMUSCULAR | Status: DC | PRN

## 2016-06-20 MED ORDER — SODIUM CHLORIDE 0.9 % IV SOLN
INTRAVENOUS | Status: DC
  Administered 2016-06-20: 15:00:00 via INTRAVENOUS

## 2016-06-20 MED ORDER — KETAMINE HCL 10 MG/ML IJ SOLN
INTRAMUSCULAR | Status: AC
  Filled 2016-06-20: qty 1

## 2016-06-20 MED ORDER — KETAMINE HCL 10 MG/ML IJ SOLN
INTRAMUSCULAR | Status: DC | PRN
  Administered 2016-06-20: 30 mg via INTRAVENOUS

## 2016-06-20 MED ORDER — ONDANSETRON HCL 4 MG/2ML IJ SOLN
INTRAMUSCULAR | Status: AC
  Filled 2016-06-20: qty 2

## 2016-06-20 MED ORDER — BUPIVACAINE HCL (PF) 0.25 % IJ SOLN
INTRAMUSCULAR | Status: DC | PRN
  Administered 2016-06-20: 30 mL

## 2016-06-20 MED ORDER — PROPOFOL 500 MG/50ML IV EMUL
INTRAVENOUS | Status: DC | PRN
  Administered 2016-06-20: 25 ug/kg/min via INTRAVENOUS

## 2016-06-20 MED ORDER — APIXABAN 2.5 MG PO TABS
2.5000 mg | ORAL_TABLET | Freq: Two times a day (BID) | ORAL | Status: DC
  Administered 2016-06-21 – 2016-06-25 (×8): 2.5 mg via ORAL
  Filled 2016-06-20 (×9): qty 1

## 2016-06-20 MED ORDER — PROPOFOL 10 MG/ML IV BOLUS
INTRAVENOUS | Status: AC
  Filled 2016-06-20: qty 40

## 2016-06-20 MED ORDER — BUPIVACAINE HCL (PF) 0.25 % IJ SOLN
INTRAMUSCULAR | Status: AC
  Filled 2016-06-20: qty 30

## 2016-06-20 MED ORDER — ISOPROPYL ALCOHOL 70 % SOLN
Status: AC
  Filled 2016-06-20: qty 480

## 2016-06-20 MED ORDER — ONDANSETRON HCL 4 MG/2ML IJ SOLN: 4.0000 mg | Freq: Four times a day (QID) | INTRAMUSCULAR | Status: DC | PRN

## 2016-06-20 SURGICAL SUPPLY — 42 items
BAG DECANTER FOR FLEXI CONT (MISCELLANEOUS) IMPLANT
BAG ZIPLOCK 12X15 (MISCELLANEOUS) IMPLANT
CAPT HIP HEMI 2 ×3 IMPLANT
CHLORAPREP W/TINT 26ML (MISCELLANEOUS) ×3 IMPLANT
CLOTH BEACON ORANGE TIMEOUT ST (SAFETY) ×3 IMPLANT
COVER PERINEAL POST (MISCELLANEOUS) ×3 IMPLANT
DECANTER SPIKE VIAL GLASS SM (MISCELLANEOUS) ×3 IMPLANT
DERMABOND ADVANCED (GAUZE/BANDAGES/DRESSINGS) ×2
DERMABOND ADVANCED .7 DNX12 (GAUZE/BANDAGES/DRESSINGS) ×1 IMPLANT
DRAPE SHEET LG 3/4 BI-LAMINATE (DRAPES) ×6 IMPLANT
DRAPE STERI IOBAN 125X83 (DRAPES) ×3 IMPLANT
DRAPE U-SHAPE 47X51 STRL (DRAPES) ×6 IMPLANT
DRSG AQUACEL AG ADV 3.5X10 (GAUZE/BANDAGES/DRESSINGS) ×3 IMPLANT
ELECT PENCIL ROCKER SW 15FT (MISCELLANEOUS) ×3 IMPLANT
ELECT REM PT RETURN 15FT ADLT (MISCELLANEOUS) ×3 IMPLANT
GAUZE SPONGE 4X4 12PLY STRL (GAUZE/BANDAGES/DRESSINGS) ×3 IMPLANT
GLOVE BIO SURGEON STRL SZ8.5 (GLOVE) ×6 IMPLANT
GLOVE BIOGEL PI IND STRL 8.5 (GLOVE) ×1 IMPLANT
GLOVE BIOGEL PI INDICATOR 8.5 (GLOVE) ×2
GOWN SPEC L3 XXLG W/TWL (GOWN DISPOSABLE) ×3 IMPLANT
HANDPIECE INTERPULSE COAX TIP (DISPOSABLE) ×2
HOLDER FOLEY CATH W/STRAP (MISCELLANEOUS) ×3 IMPLANT
HOOD PEEL AWAY FLYTE STAYCOOL (MISCELLANEOUS) ×6 IMPLANT
MARKER SKIN DUAL TIP RULER LAB (MISCELLANEOUS) ×3 IMPLANT
NEEDLE SPNL 18GX3.5 QUINCKE PK (NEEDLE) ×3 IMPLANT
PACK ANTERIOR HIP CUSTOM (KITS) ×3 IMPLANT
SAW OSC TIP CART 19.5X105X1.3 (SAW) ×3 IMPLANT
SEALER BIPOLAR AQUA 6.0 (INSTRUMENTS) ×3 IMPLANT
SET HNDPC FAN SPRY TIP SCT (DISPOSABLE) ×1 IMPLANT
SOL PREP POV-IOD 4OZ 10% (MISCELLANEOUS) ×3 IMPLANT
SUT ETHIBOND NAB CT1 #1 30IN (SUTURE) ×6 IMPLANT
SUT MNCRL AB 3-0 PS2 18 (SUTURE) ×3 IMPLANT
SUT MON AB 2-0 CT1 36 (SUTURE) ×6 IMPLANT
SUT STRATAFIX PDO 1 14 VIOLET (SUTURE) ×2
SUT STRATFX PDO 1 14 VIOLET (SUTURE) ×1
SUT VIC AB 2-0 CT1 27 (SUTURE) ×2
SUT VIC AB 2-0 CT1 TAPERPNT 27 (SUTURE) ×1 IMPLANT
SUTURE STRATFX PDO 1 14 VIOLET (SUTURE) ×1 IMPLANT
SYR 50ML LL SCALE MARK (SYRINGE) ×3 IMPLANT
TRAY FOLEY W/METER SILVER 16FR (SET/KITS/TRAYS/PACK) IMPLANT
WATER STERILE IRR 1500ML POUR (IV SOLUTION) ×3 IMPLANT
YANKAUER SUCT BULB TIP 10FT TU (MISCELLANEOUS) ×3 IMPLANT

## 2016-06-20 NOTE — Progress Notes (Signed)
PROGRESS NOTE    Brittany Chambers  [id number] DOB: [birth year] DOA: 06/18/2016 PCP: Leeroy Cha, MD    Brief Narrative:  Patient is a 82 year old lady history of dementia, recurrent cataracts, gastroesophageal reflux disease, hypothyroidism, failure to thrive who presented to the ED after a fall while using the bathroom. Patient noted to have a left hip fracture. Patient also noted to be significantly hypotensive and in a flutter with RVR. First set of troponins mildly elevated. Patient placed on a Cardizem drip in place in the step down unit. Cardiology consulted for preop clearance and management of A. fib and elevated troponins. Patient s/p hip repair 06/20/2016.   Assessment & Plan:   Principal Problem:   Closed left hip fracture (HCC) Active Problems:   Atrial flutter with rapid ventricular response (HCC)   UTI (urinary tract infection)   Dementia   Hypothyroidism   Hyperglycemia   DM (diabetes mellitus), type 2 (HCC)   Fall at home   RBBB   Elevated troponin   Closed subcapital fracture of left femur (HCC)   Left displaced femoral neck fracture (HCC)  #1 closed left hip fracture Likely secondary to mechanical fall patient sustained while going to the bathroom. Patient has been seen in consultation by orthopedics and patient status post left hip hemiarthroplasty 06/20/2016 per Dr. Lyla Glassing. 2-D echo ordered however per cardiology OB difficult to obtain good images and very difficult to turn patient without causing significant pain. Per cardiology feels patient has good LV function based on normal blood pressure, heart rate and good pulses and feel not necessary to obtain echo prior to surgery. Patient was seen by cardiology preoperatively. Per orthopedics, patient to be started eliquis for DVT prophylaxis x 1 month.  Orthopedics following.  #2 atrial flutter with RVR CHA2DS2VASC = 4 Patient noted to be in a flutter with RVR on admission and placed on a Cardizem  drip currently now with rate control. Cardiology recommended addition of low-dose beta blocker which was started last evening with Coreg 3.125 mg twice a day. Patient not on anticoagulation and patient high risk for falls and as such likely not a good long-term candidate for chronic anticoagulation. Cardiology following and appreciate input and recommendations.  #3 dementia Stable.  #4 urinary tract infection Urine cultures pending. Continue IV Rocephin.  #5 diabetes mellitus type 2 Blood glucose in the 200s on admission. CBGs currently ranging from 166-184. Continue low-dose Lantus 5 units daily. Continue sliding scale insulin.  #6 elevated troponin May be secondary to demand ischemia secondary to fall versus NSTEMI. Cardiac enzymes mildly elevated to 0.11 which seems to have plateaued. Patient denied any chest pain. 2-D echo pending. Patient has been started on Coreg 3.125 mg twice a day. Continue current Cardizem drip. Cardiology following and appreciate input and recommendations.  #7 hypothyroidism TSH at 5.188. Patient was noted to be taking Synthroid daily except on 2 days during the week. Continue Synthroid on a daily basis and will need repeat thyroid function studies done in about 4-6 weeks.  #8 right bundle branch block  #9 Fall Questionable etiology. Mechanical versus secondary to UTI versus secondary to elevated troponins. Urine cultures pending. Patient on empiric IV antibiotics. Cardiac enzymes elevated up to 0.11 which seems to have plateaued.  2-D echo pending. Cardiology following. PT/OT postoperatively or when okay with orthopedics.    DVT prophylaxis: SCDs Code Status: DO NOT RESUSCITATE Family Communication: Updated patient daughter at bedside. Disposition Plan: Likely skilled nursing facility versus home with home health postoperatively  and per orthopedics.   Consultants:   Orthopedics: Dr. Alvan Dame 06/18/2016  Cardiology: Dr. Acie Fredrickson 06/19/2016  Procedures:    Chest x-ray 06/18/2016  Plain films of the left hip 06/18/2016  Plain. The left tib-fib 06/18/2016  Left hip hemiarthroplasty 06/20/2016 per Dr.Swinteck  Antimicrobials:   IV Rocephin 06/18/2016   Subjective: Patient laying in bed postop. Denies CP. No SOB.  Objective: Vitals:   06/20/16 1130 06/20/16 1145 06/20/16 1208 06/20/16 1300  BP: 126/71 135/81 (!) 147/69 119/66  Pulse: 94 92 91 92  Resp: 15 15 18 18   Temp: 98.1 F (36.7 C)  97.6 F (36.4 C) 97 F (36.1 C)  TempSrc:    Axillary  SpO2: 99% 100% 100% 100%  Weight:      Height:        Intake/Output Summary (Last 24 hours) at 06/20/16 1416 Last data filed at 06/20/16 1205  Gross per 24 hour  Intake             1237 ml  Output              525 ml  Net              712 ml   Filed Weights   06/18/16 2200  Weight: 58 kg (127 lb 13.9 oz)    Examination:  General exam: In bed. Postop. Respiratory system: Clear to auscultation anterior lung fields.Marland Kitchen Respiratory effort normal. Cardiovascular system: Irregularly irregular. No JVD, murmurs, rubs, gallops or clicks. No pedal edema. Gastrointestinal system: Abdomen is nondistended, soft and nontender. No organomegaly or masses felt. Normal bowel sounds heard. Central nervous system: Alert. Disoriented. No focal neurological deficits. Extremities: No c/c/e Skin: No rashes, lesions or ulcers Psychiatry: Judgement and insight appear normal. Mood & affect appropriate.     Data Reviewed: I have personally reviewed following labs and imaging studies  CBC:  Recent Labs Lab 06/18/16 1434 06/19/16 0157 06/20/16 0405  WBC 7.4 14.6* 13.2*  NEUTROABS 5.3  --  11.4*  HGB 15.8* 15.0 14.3  HCT 45.5 41.0 40.2  MCV 95.0 92.8 95.7  PLT 172 158 [id number]   Basic Metabolic Panel:  Recent Labs Lab 06/18/16 1434 06/19/16 0157 06/20/16 0405  NA 133* 131* 134*  K 4.0 4.4 4.4  CL 97* 101 104  CO2 23 21* 20*  GLUCOSE 219* 236* 191*  BUN 14 16 16   CREATININE 1.57*  1.46* 1.19*  CALCIUM 9.7 8.9 9.3   GFR: Estimated Creatinine Clearance: 23.2 mL/min (by C-G formula based on SCr of 1.19 mg/dL (H)). Liver Function Tests:  Recent Labs Lab 06/19/16 0157  ALBUMIN 3.9   No results for input(s): LIPASE, AMYLASE in the last 168 hours. No results for input(s): AMMONIA in the last 168 hours. Coagulation Profile:  Recent Labs Lab 06/18/16 1434 06/19/16 0157  INR 0.98 1.03   Cardiac Enzymes:  Recent Labs Lab 06/18/16 2057 06/19/16 0157 06/19/16 0806 06/19/16 1345 06/19/16 2008  TROPONINI 0.03* 0.09* 0.11* 0.11* 0.10*   BNP (last 3 results) No results for input(s): PROBNP in the last 8760 hours. HbA1C:  Recent Labs  06/18/16 2057  HGBA1C 6.5*   CBG:  Recent Labs Lab 06/20/16 0022 06/20/16 0441 06/20/16 0746 06/20/16 1052 06/20/16 1217  GLUCAP 156* 181* 182* 184* 166*   Lipid Profile: No results for input(s): CHOL, HDL, LDLCALC, TRIG, CHOLHDL, LDLDIRECT in the last 72 hours. Thyroid Function Tests:  Recent Labs  06/18/16 1649  TSH 5.188*   Anemia Panel: No results for input(s):  VITAMINB12, FOLATE, FERRITIN, TIBC, IRON, RETICCTPCT in the last 72 hours. Sepsis Labs:  Recent Labs Lab 06/18/16 1658  LATICACIDVEN 1.75    Recent Results (from the past 240 hour(s))  Urine culture     Status: Abnormal (Preliminary result)   Collection Time: 06/18/16  6:10 PM  Result Value Ref Range Status   Specimen Description URINE, RANDOM  Final   Special Requests NONE  Final   Culture >=100,000 COLONIES/mL GRAM NEGATIVE RODS (A)  Final   Report Status PENDING  Incomplete  MRSA PCR Screening     Status: None   Collection Time: 06/18/16  8:48 PM  Result Value Ref Range Status   MRSA by PCR NEGATIVE NEGATIVE Final    Comment:        The GeneXpert MRSA Assay (FDA approved for NASAL specimens only), is one component of a comprehensive MRSA colonization surveillance program. It is not intended to diagnose MRSA infection nor to  guide or monitor treatment for MRSA infections.          Radiology Studies: Dg Tibia/fibula Left  Result Date: 06/18/2016 CLINICAL DATA:  Fall, left lower leg pain EXAM: LEFT TIBIA AND FIBULA - 2 VIEW COMPARISON:  None. FINDINGS: No fracture or dislocation is seen. Mild degenerative changes with lateral compartment chondrocalcinosis. Vascular calcifications. IMPRESSION: No acute osseus abnormality is seen. Electronically Signed   By: Julian Hy M.D.   On: 06/18/2016 16:27   Ct Head Wo Contrast  Result Date: 06/18/2016 CLINICAL DATA:  Fall, hit the left side of the head EXAM: CT HEAD WITHOUT CONTRAST CT CERVICAL SPINE WITHOUT CONTRAST TECHNIQUE: Multidetector CT imaging of the head and cervical spine was performed following the standard protocol without intravenous contrast. Multiplanar CT image reconstructions of the cervical spine were also generated. COMPARISON:  09/04/2015 FINDINGS: CT HEAD FINDINGS Brain: No intracranial hemorrhage, mass effect or midline shift. Stable cerebral atrophy. Stable extensive periventricular and patchy subcortical chronic white matter disease. No acute cortical infarction. No mass lesion is noted on this unenhanced scan. Vascular: Atherosclerotic calcifications of carotid siphon. Skull: No skull fracture is noted. Sinuses/Orbits: No paranasal sinuses air-fluid levels. Other: There is scalp swelling and subcutaneous stranding in left lateral extra orbital region and left temporal/ zygomatic region. CT CERVICAL SPINE FINDINGS Alignment: There is normal alignment. Skull base and vertebrae: No acute fracture or subluxation. Degenerative changes are noted C1-C2 articulation. There is mild anterior and mild posterior spurring at C4-C5 and C5-C6 and C6-C7 level. Mild posterior spurring at C7-T1 level. Soft tissues and spinal canal: No prevertebral soft tissue swelling. Mild spinal canal stenosis due to posterior spurring at C4-C5 and C5-C6 level. Disc levels: Mild  disc space flattening at C4-C5, C5-C6 and C6-C7 level. Upper chest: There is no pneumothorax in visualized lung apices. Other: Atherosclerotic calcifications of vertebral arteries are noted. Extensive atherosclerotic calcifications bilateral carotid bifurcation. IMPRESSION: 1. No acute intracranial abnormality. 2. Stable atrophy and extensive chronic white matter disease. No definite acute cortical infarction. 3. There is scalp swelling and subcutaneous stranding left lateral extra orbital region and left temporal/zygomatic region. Clinical correlation is necessary. 4. No cervical spine acute fracture or subluxation. Multilevel degenerative changes as described above. Electronically Signed   By: Lahoma Crocker M.D.   On: 06/18/2016 15:22   Ct Cervical Spine Wo Contrast  Result Date: 06/18/2016 CLINICAL DATA:  Fall, hit the left side of the head EXAM: CT HEAD WITHOUT CONTRAST CT CERVICAL SPINE WITHOUT CONTRAST TECHNIQUE: Multidetector CT imaging of the head and cervical  spine was performed following the standard protocol without intravenous contrast. Multiplanar CT image reconstructions of the cervical spine were also generated. COMPARISON:  09/04/2015 FINDINGS: CT HEAD FINDINGS Brain: No intracranial hemorrhage, mass effect or midline shift. Stable cerebral atrophy. Stable extensive periventricular and patchy subcortical chronic white matter disease. No acute cortical infarction. No mass lesion is noted on this unenhanced scan. Vascular: Atherosclerotic calcifications of carotid siphon. Skull: No skull fracture is noted. Sinuses/Orbits: No paranasal sinuses air-fluid levels. Other: There is scalp swelling and subcutaneous stranding in left lateral extra orbital region and left temporal/ zygomatic region. CT CERVICAL SPINE FINDINGS Alignment: There is normal alignment. Skull base and vertebrae: No acute fracture or subluxation. Degenerative changes are noted C1-C2 articulation. There is mild anterior and mild  posterior spurring at C4-C5 and C5-C6 and C6-C7 level. Mild posterior spurring at C7-T1 level. Soft tissues and spinal canal: No prevertebral soft tissue swelling. Mild spinal canal stenosis due to posterior spurring at C4-C5 and C5-C6 level. Disc levels: Mild disc space flattening at C4-C5, C5-C6 and C6-C7 level. Upper chest: There is no pneumothorax in visualized lung apices. Other: Atherosclerotic calcifications of vertebral arteries are noted. Extensive atherosclerotic calcifications bilateral carotid bifurcation. IMPRESSION: 1. No acute intracranial abnormality. 2. Stable atrophy and extensive chronic white matter disease. No definite acute cortical infarction. 3. There is scalp swelling and subcutaneous stranding left lateral extra orbital region and left temporal/zygomatic region. Clinical correlation is necessary. 4. No cervical spine acute fracture or subluxation. Multilevel degenerative changes as described above. Electronically Signed   By: Lahoma Crocker M.D.   On: 06/18/2016 15:22   Pelvis Portable  Result Date: 06/20/2016 CLINICAL DATA:  Postoperative hemi left hip replacement. EXAM: PORTABLE PELVIS 1-2 VIEWS COMPARISON:  Abdominal CT 07/24/2015 FINDINGS: Status post left hip hemiarthroplasty with normal alignment of the prosthetic left femoral head with the acetabulum. Expected postsurgical changes. No evidence of fracture. IMPRESSION: Status post left hip hemiarthroplasty without evidence of immediate complications. Electronically Signed   By: Fidela Salisbury M.D.   On: 06/20/2016 11:20   Dg Chest Portable 1 View  Result Date: 06/18/2016 CLINICAL DATA:  Hip fracture.  Dementia EXAM: PORTABLE CHEST 1 VIEW COMPARISON:  03/11/2015 FINDINGS: Chronic borderline cardiomegaly, accentuated by technique and rotation. Diffuse atherosclerotic calcification of the aorta. There is no edema, consolidation, effusion, or pneumothorax. Osteopenia. No acute osseous finding. IMPRESSION: No evidence of active  disease. Electronically Signed   By: Monte Fantasia M.D.   On: 06/18/2016 16:44   Dg C-arm 1-60 Min-no Report  Result Date: 06/20/2016 There is no Radiologist interpretation  for this exam.  Dg Hip Operative Unilat W Or W/o Pelvis Left  Result Date: 06/20/2016 CLINICAL DATA:  Left hip arthroplasty post femoral neck fracture. EXAM: OPERATIVE left HIP (WITH PELVIS IF PERFORMED) 3 VIEWS TECHNIQUE: Fluoroscopic spot image(s) were submitted for interpretation post-operatively. COMPARISON:  06/18/2016 and 06/20/2016 FINDINGS: There has been placement of a left hip arthroplasty intact and normally located. Recommend correlation with findings at the time of the procedure. IMPRESSION: Left hip arthroplasty intact and normally located. Electronically Signed   By: Marin Olp M.D.   On: 06/20/2016 11:34   Dg Hip Unilat With Pelvis 2-3 Views Left  Result Date: 06/18/2016 CLINICAL DATA:  Fall, left hip pain EXAM: DG HIP (WITH OR WITHOUT PELVIS) 2-3V LEFT COMPARISON:  None. FINDINGS: Subcapital left hip fracture. Visualized bony pelvis appears intact. Bilobed hip joint spaces are preserved. IMPRESSION: Subcapital left hip fracture. Electronically Signed   By: Bertis Ruddy  [place] Pink M.D.   On: 06/18/2016 16:09        Scheduled Meds: . ALPRAZolam  1 mg Oral QHS  . [START ON 06/21/2016] apixaban  2.5 mg Oral BID  . carvedilol  3.125 mg Oral BID WC  .  ceFAZolin (ANCEF) IV  2 g Intravenous Q6H  . cefTRIAXone (ROCEPHIN)  IV  1 g Intravenous Q24H  . famotidine  10 mg Oral Daily  . insulin aspart  0-9 Units Subcutaneous Q4H  . insulin glargine  5 Units Subcutaneous Daily  . levothyroxine  100 mcg Oral Once per day on Sun Tue Thu Fri Sat  . loratadine  10 mg Oral Daily  . traMADol-acetaminophen  1 tablet Oral BID   Continuous Infusions: . diltiazem (CARDIZEM) infusion 5 mg/hr (06/20/16 1130)     LOS: 2 days    Time spent: 66 mins    Yazlyn Wentzel, MD [facility] Pager [phone]  [phone]  If 7PM-7AM, please contact night-coverage [email or web address] Password [email or web address] 06/20/2016, 2:16 PM

## 2016-06-20 NOTE — Interval H&P Note (Signed)
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 After consideration of risks, benefits and other options for treatment, the patient has consented to  Procedure(s): ANTERIOR APPROACH HEMI HIP ARTHROPLASTY Left (Left) as a surgical intervention .  The patient's history has been reviewed, patient examined, no change in status, stable for surgery.  I have reviewed the patient's chart and labs.  Questions were answered to the patient's satisfaction.    I had a lengthy discussion with the family regarding the nature of her left hip fracture. Unfortunately the patient has some new and ongoing cardiac issues. We discussed that nonoperative management will result in the patient experiencing left hip pain and she will be on bed rest. The immobility created from nonoperative treatment is associated with a extremely high risk of morbidity and mortality. We discussed hemiarthroplasty as a means to control her pain and to allow mobility. There will be increased risk of the surgery due to her advanced age and cardiac issues. She has approximately 30% chance of 1 year mortality. They understand this and elected to proceed with surgery. Please see statement of risk below.  The risks, benefits, and alternatives were discussed with the patient and her family. There are risks associated with the surgery including, but not limited to, problems with anesthesia (death), infection, instability (giving out of the joint), dislocation, differences in leg length/angulation/rotation, fracture of bones, loosening or failure of implants, hematoma (blood accumulation) which may require surgical drainage, blood clots, pulmonary embolism, nerve injury (foot drop and lateral thigh numbness), and blood vessel injury. The patient and her family  understand these risks and elects to proceed.    Mayes Sangiovanni, Horald Pollen

## 2016-06-20 NOTE — Discharge Instructions (Signed)
°Dr. Adam Demary °Joint Replacement Specialist °[facility] °[address] °[place], [place] [address] °[phone] ° ° °TOTAL HIP REPLACEMENT POSTOPERATIVE DIRECTIONS ° ° ° °Hip Rehabilitation, Guidelines Following Surgery  ° °WEIGHT BEARING °Weight bearing as tolerated with assist device (walker, cane, etc) as directed, use it as long as suggested by your surgeon or therapist, typically at least 4-6 weeks. ° °The results of a hip operation are greatly improved after range of motion and muscle strengthening exercises. Follow all safety measures which are given to protect your hip. If any of these exercises cause increased pain or swelling in your joint, decrease the amount until you are comfortable again. Then slowly increase the exercises. Call your caregiver if you have problems or questions.  ° °HOME CARE INSTRUCTIONS  °Most of the following instructions are designed to prevent the dislocation of your new hip.  °Remove items at home which could result in a fall. This includes throw rugs or furniture in walking pathways.  °Continue medications as instructed at time of discharge. °· You may have some home medications which will be placed on hold until you complete the course of blood thinner medication. °· You may start showering once you are discharged home. Do not remove your dressing. °Do not put on socks or shoes without following the instructions of your caregivers.   °Sit on chairs with arms. Use the chair arms to help push yourself up when arising.  °Arrange for the use of a toilet seat elevator so you are not sitting low.  °· Walk with walker as instructed.  °You may resume a sexual relationship in one month or when given the OK by your caregiver.  °Use walker as long as suggested by your caregivers.  °You may put full weight on your legs and walk as much as is comfortable. °Avoid periods of inactivity such as sitting longer than an hour when not asleep. This helps prevent  blood clots.  °You may return to work once you are cleared by your surgeon.  °Do not drive a car for 6 weeks or until released by your surgeon.  °Do not drive while taking narcotics.  °Wear elastic stockings for two weeks following surgery during the day but you may remove then at night.  °Make sure you keep all of your appointments after your operation with all of your doctors and caregivers. You should call the office at the above phone number and make an appointment for approximately two weeks after the date of your surgery. °Please pick up a stool softener and laxative for home use as long as you are requiring pain medications. °· ICE to the affected hip every three hours for 30 minutes at a time and then as needed for pain and swelling. Continue to use ice on the hip for pain and swelling from surgery. You may notice swelling that will progress down to the foot and ankle.  This is normal after surgery.  Elevate the leg when you are not up walking on it.   °It is important for you to complete the blood thinner medication as prescribed by your doctor. °· Continue to use the breathing machine which will help keep your temperature down.  It is common for your temperature to cycle up and down following surgery, especially at night when you are not up moving around and exerting yourself.  The breathing machine keeps your lungs expanded and your temperature down. ° °RANGE OF MOTION AND STRENGTHENING EXERCISES  °These exercises are   designed to help you keep full movement of your hip joint. Follow your caregiver's or physical therapist's instructions. Perform all exercises about fifteen times, three times per day or as directed. Exercise both hips, even if you have had only one joint replacement. These exercises can be done on a training (exercise) mat, on the floor, on a table or on a bed. Use whatever works the best and is most comfortable for you. Use music or television while you are exercising so that the exercises  are a pleasant break in your day. This will make your life better with the exercises acting as a break in routine you can look forward to.  °Lying on your back, slowly slide your foot toward your buttocks, raising your knee up off the floor. Then slowly slide your foot back down until your leg is straight again.  °Lying on your back spread your legs as far apart as you can without causing discomfort.  °Lying on your side, raise your upper leg and foot straight up from the floor as far as is comfortable. Slowly lower the leg and repeat.  °Lying on your back, tighten up the muscle in the front of your thigh (quadriceps muscles). You can do this by keeping your leg straight and trying to raise your heel off the floor. This helps strengthen the largest muscle supporting your knee.  °Lying on your back, tighten up the muscles of your buttocks both with the legs straight and with the knee bent at a comfortable angle while keeping your heel on the floor.  ° °SKILLED REHAB INSTRUCTIONS: °If the patient is transferred to a skilled rehab facility following release from the hospital, a list of the current medications will be sent to the facility for the patient to continue.  When discharged from the skilled rehab facility, please have the facility set up the patient's Home Health Physical Therapy prior to being released. Also, the skilled facility will be responsible for providing the patient with their medications at time of release from the facility to include their pain medication and their blood thinner medication. If the patient is still at the rehab facility at time of the two week follow up appointment, the skilled rehab facility will also need to assist the patient in arranging follow up appointment in our office and any transportation needs. ° °MAKE SURE YOU:  °Understand these instructions.  °Will watch your condition.  °Will get help right away if you are not doing well or get worse. ° °Pick up stool softner and  laxative for home use following surgery while on pain medications. °Do not remove your dressing. °The dressing is waterproof--it is OK to take showers. °Continue to use ice for pain and swelling after surgery. °Do not use any lotions or creams on the incision until instructed by your surgeon. °Total Hip Protocol. ° ° °

## 2016-06-20 NOTE — Anesthesia Postprocedure Evaluation (Signed)
Anesthesia Post Note  Patient: Brittany Chambers  Procedure(s) Performed: Procedure(s) (LRB): ANTERIOR APPROACH HEMI HIP ARTHROPLASTY Left (Left)  Patient location during evaluation: PACU Anesthesia Type: Spinal Level of consciousness: awake and alert Pain management: pain level controlled Vital Signs Assessment: post-procedure vital signs reviewed and stable Respiratory status: spontaneous breathing and respiratory function stable Cardiovascular status: blood pressure returned to baseline and stable Postop Assessment: no headache, no backache and spinal receding Anesthetic complications: no       Last Vitals:  Vitals:   06/20/16 1400 06/20/16 1500  BP: (!) 88/49 (!) 94/58  Pulse: 95 83  Resp: 16 (!) 21  Temp:  36.3 C    Last Pain:  Vitals:   06/20/16 1500  TempSrc: Oral  PainSc:                  Montez Hageman

## 2016-06-20 NOTE — Anesthesia Preprocedure Evaluation (Signed)
Anesthesia Evaluation  Patient identified by MRN, date of birth, ID band Patient awake    Reviewed: Allergy & Precautions, NPO status , Patient's Chart, lab work & pertinent test results  Airway Mallampati: II  TM Distance: >3 FB Neck ROM: Full    Dental no notable dental hx.    Pulmonary neg pulmonary ROS,    Pulmonary exam normal breath sounds clear to auscultation       Cardiovascular negative cardio ROS Normal cardiovascular exam+ dysrhythmias Atrial Fibrillation  Rhythm:Irregular Rate:Normal     Neuro/Psych Severe dementia negative psych ROS   GI/Hepatic negative GI ROS, Neg liver ROS,   Endo/Other  diabetes, Type 2  Renal/GU negative Renal ROS  negative genitourinary   Musculoskeletal negative musculoskeletal ROS (+)   Abdominal   Peds negative pediatric ROS (+)  Hematology negative hematology ROS (+)   Anesthesia Other Findings   Reproductive/Obstetrics negative OB ROS                             Anesthesia Physical Anesthesia Plan  ASA: III  Anesthesia Plan: General and Spinal   Post-op Pain Management:    Induction:   Airway Management Planned: Simple Face Mask  Additional Equipment:   Intra-op Plan:   Post-operative Plan:   Informed Consent: I have reviewed the patients History and Physical, chart, labs and discussed the procedure including the risks, benefits and alternatives for the proposed anesthesia with the patient or authorized representative who has indicated his/her understanding and acceptance.   Dental advisory given  Plan Discussed with: CRNA  Anesthesia Plan Comments:         Anesthesia Quick Evaluation

## 2016-06-20 NOTE — Progress Notes (Signed)
Unable to assess Epidual/Spinal Function due to Advanced Dementia and inability to follow commands or answer questions. There has been no voluntary movement of lower extremities noted,

## 2016-06-20 NOTE — Progress Notes (Signed)
Patient family located, son and daughter, sent to room

## 2016-06-20 NOTE — Op Note (Addendum)
OPERATIVE REPORT  SURGEON: Rod Can, MD   ASSISTANT: Ardeen Jourdain, PA-C.  PREOPERATIVE DIAGNOSIS: Displaced Left femoral neck fracture.   POSTOPERATIVE DIAGNOSIS: Displaced Left femoral neck fracture.   PROCEDURE: Left hip hemiarthroplasty, anterior approach.   IMPLANTS: DePuy Tri Lock stem, size 4, standard offset, with a 0 mm spacer and a 44 mm monopolar head ball.  ANESTHESIA:  Spinal  ANTIBIOTICS: 2g ancef.  ESTIMATED BLOOD LOSS: 150 mL.  DRAINS: None.  COMPLICATIONS: None   CONDITION: PACU - hemodynamically stable.   BRIEF CLINICAL NOTE: Brittany Chambers is a 82 y.o. female with a displaced Left femoral neck fracture. The patient was admitted to the hospitalist service and underwent perioperative risk stratification and medical optimization. The risks, benefits, and alternatives to hemiarthroplasty were explained, and the patient elected to proceed.  PROCEDURE IN DETAIL: The patient was taken to the operating room and spinal anesthesia was induced on the hospital bed. The patient was then positioned on the Hana table. All bony prominences were well padded. The hip was prepped and draped in the normal sterile surgical fashion. A time-out was called verifying side and site of surgery. Antibiotics were given within 60 minutes of beginning the procedure.  The direct anterior approach to the hip was performed through the Hueter interval. Lateral femoral circumflex vessels were treated with the Auqumantys. The anterior capsule was exposed and an inverted T capsulotomy was made. Fracture hematoma was encountered and evacuated. The patient was found to have a comminuted Left subcapital femoral neck fracture. I freshened the femoral neck cut with a saw. I removed the femoral neck fragment. A corkscrew was placed into the head and the head was removed. This was passed to the back table and was measured.  Acetabular exposure was achieved. I examined the  articular cartilage which was intact. The labrum was intact. A 44 mm trial head was placed and found to have excellent fit.  I then gained femoral exposure taking care to protect the abductors and greater trochanter. This was performed using standard external rotation, extension, and adduction. The capsule was peeled off the inner aspect of the greater trochanter, taking care to preserve the short external rotators. A cookie cutter was used to enter the femoral canal, and then the femoral canal finder was used to confirm location. I then sequentially broached up to a size 4. Calcar planer was used on the femoral neck remnant. I paced a std neck and a 36+ 1.5 head ball.The hip was reduced. Leg lengths were checked fluoroscopically. The hip was dislocated and trial components were removed. I placed the real stem followed by the real spacer and head ball. A single reduction maneuver was performed and the hip was reduced. Fluoroscopy was used to confirm component position and leg lengths. At 90 degrees of external rotation and extension, the hip was stable to an anterior directed force.  The wound was copiously irrigated with normal saline solution. Marcaine solution was injected into the periarticular soft tissue. The wound was closed in layers using #1 Vicryl and V-Loc for the fascia, 2-0 Vicryl for the subcutaneous fat, 2-0 Monocryl for the deep dermal layer, 3-0 running Monocryl subcuticular stitch and glue for the skin. Once the glue was fully dried, an Aquacell Ag dressing was applied. The patient was then awakened from anesthesia and transported to the recovery room in stable condition. Sponge, needle, and instrument counts were correct at the end of the case x2. The patient tolerated the procedure well and there were no  known complications.  Please note that a surgical assistant was a medical necessity for this procedure to perform it in a safe and expeditious manner. Assistant was  necessary to provide appropriate retraction of vital neurovascular structures, to prevent femoral fracture, and to allow for anatomic placement of the prosthesis.  Postoperatively, we will readmit the patient to the hospitalist service. She may weight-bear as tolerated with a walker. We will start Eliquis 2.5 mg by mouth twice a day for 30 days for DVT prophylaxis. She will work with PT/OT. She will need disposition planning. I will see her in the office 2 weeks after discharge.

## 2016-06-20 NOTE — Transfer of Care (Signed)
Immediate Anesthesia Transfer of Care Note  Patient: Brittany Chambers  Procedure(s) Performed: Procedure(s): ANTERIOR APPROACH HEMI HIP ARTHROPLASTY Left (Left)  Patient Location: PACU  Anesthesia Type:MAC and Spinal  Level of Consciousness: awake, alert , oriented and patient cooperative  Airway & Oxygen Therapy: Patient Spontanous Breathing and Patient connected to face mask oxygen  Post-op Assessment: Report given to RN and Post -op Vital signs reviewed and stable  Post vital signs: Reviewed and stable  Last Vitals:  Vitals:   06/20/16 0600 06/20/16 0800  BP:  (!) 144/72  Pulse: (!) 112   Resp: 16 (!) 25  Temp:  36.8 C    Last Pain:  Vitals:   06/20/16 0800  TempSrc: Oral  PainSc:          Complications: No apparent anesthesia complications

## 2016-06-20 NOTE — H&P (View-Only) (Signed)
Reason for Consult:  Subcapital left hip fracture Referring Physician:   ED physician  Brittany Chambers is an 82 y.o. female.  HPI:  Patient with a medical history significant of dementia, GERD, hypothyroidism, diet controlled DM, chronic UTIs,and failure to thrive.  She lives at home with her daughter and was admitted 06/18/16, after she fell in the bathroom and suffered a left hip fracture.  Orthopedics was consulted.  Dr. Alvan Dame saw the patient and has asked Dr. Lyla Glassing to take over orthopedic care of the patient, which he has agreed.    On arrival to the her blood pressure elevated 199/158 heart rate up to 148 admission. EKG shows atrial flutter with 2:1 conduction and RBBB.  Cardiology was consulted.  Patient placed on a Cardizem drip in place in the step down unit. If the patient is cleared for surgery Dr. Lyla Glassing plans on doing surgery on 06/20/2016.   Past Medical History:  Diagnosis Date  . Dementia   . GERD (gastroesophageal reflux disease)   . Hypothyroidism   . Macular degeneration bilateral    Past Surgical History:  Procedure Laterality Date  . ABDOMINAL HYSTERECTOMY    . CHOLECYSTECTOMY    . EYE SURGERY      Family History  Problem Relation Age of Onset  . CAD Mother   . CAD Father   . Stroke Neg Hx   . Diabetes Neg Hx     Social History:  reports that she has never smoked. She has never used smokeless tobacco. She reports that she drinks alcohol. She reports that she does not use drugs.  Allergies: No Known Allergies    Results for orders placed or performed during the hospital encounter of 06/18/16 (from the past 48 hour(s))  Urinalysis, Routine w reflex microscopic     Status: Abnormal   Collection Time: 06/18/16  2:10 PM  Result Value Ref Range   Color, Urine YELLOW YELLOW   APPearance HAZY (A) CLEAR   Specific Gravity, Urine 1.013 1.005 - 1.030   pH 5.0 5.0 - 8.0   Glucose, UA NEGATIVE NEGATIVE mg/dL   Hgb urine dipstick SMALL (A) NEGATIVE    Bilirubin Urine NEGATIVE NEGATIVE   Ketones, ur NEGATIVE NEGATIVE mg/dL   Protein, ur NEGATIVE NEGATIVE mg/dL   Nitrite POSITIVE (A) NEGATIVE   Leukocytes, UA LARGE (A) NEGATIVE   RBC / HPF 0-5 0 - 5 RBC/hpf   WBC, UA 6-30 0 - 5 WBC/hpf   Bacteria, UA RARE (A) NONE SEEN   Squamous Epithelial / LPF NONE SEEN NONE SEEN   Mucous PRESENT    Hyaline Casts, UA PRESENT   Type and screen [place]     Status: None   Collection Time: 06/18/16  2:32 PM  Result Value Ref Range   ABO/RH(D) B NEG    Antibody Screen NEG    Sample Expiration 06/21/2016   ABO/Rh     Status: None   Collection Time: 06/18/16  2:32 PM  Result Value Ref Range   ABO/RH(D) B NEG   Basic metabolic panel     Status: Abnormal   Collection Time: 06/18/16  2:34 PM  Result Value Ref Range   Sodium 133 (L) 135 - 145 mmol/L   Potassium 4.0 3.5 - 5.1 mmol/L   Chloride 97 (L) 101 - 111 mmol/L   CO2 23 22 - 32 mmol/L   Glucose, Bld 219 (H) 65 - 99 mg/dL   BUN 14 6 - 20 mg/dL  Creatinine, Ser 1.57 (H) 0.44 - 1.00 mg/dL   Calcium 9.7 8.9 - 10.3 mg/dL   GFR calc non Af Amer 27 (L) >60 mL/min   GFR calc Af Amer 31 (L) >60 mL/min    Comment: (NOTE) The eGFR has been calculated using the CKD EPI equation. This calculation has not been validated in all clinical situations. eGFR's persistently <60 mL/min signify possible Chronic Kidney Disease.    Anion gap 13 5 - 15  CBC WITH DIFFERENTIAL     Status: Abnormal   Collection Time: 06/18/16  2:34 PM  Result Value Ref Range   WBC 7.4 4.0 - 10.5 K/uL   RBC 4.79 3.87 - 5.11 MIL/uL   Hemoglobin 15.8 (H) 12.0 - 15.0 g/dL   HCT 45.5 36.0 - 46.0 %   MCV 95.0 78.0 - 100.0 fL   MCH 33.0 26.0 - 34.0 pg   MCHC 34.7 30.0 - 36.0 g/dL   RDW 13.3 11.5 - 15.5 %   Platelets 172 150 - 400 K/uL   Neutrophils Relative % 72 %   Neutro Abs 5.3 1.7 - 7.7 K/uL   Lymphocytes Relative 17 %   Lymphs Abs 1.3 0.7 - 4.0 K/uL   Monocytes Relative 8 %   Monocytes Absolute 0.6  0.1 - 1.0 K/uL   Eosinophils Relative 3 %   Eosinophils Absolute 0.2 0.0 - 0.7 K/uL   Basophils Relative 0 %   Basophils Absolute 0.0 0.0 - 0.1 K/uL  Protime-INR     Status: None   Collection Time: 06/18/16  2:34 PM  Result Value Ref Range   Prothrombin Time 13.0 11.4 - 15.2 seconds   INR 0.98   TSH     Status: Abnormal   Collection Time: 06/18/16  4:49 PM  Result Value Ref Range   TSH 5.188 (H) 0.350 - 4.500 uIU/mL    Comment: Performed by a 3rd Generation assay with a functional sensitivity of <=0.01 uIU/mL.  I-Stat CG4 Lactic Acid, ED     Status: None   Collection Time: 06/18/16  4:58 PM  Result Value Ref Range   Lactic Acid, Venous 1.75 0.5 - 1.9 mmol/L  Troponin I     Status: None   Collection Time: 06/18/16  6:40 PM  Result Value Ref Range   Troponin I <0.03 <0.03 ng/mL  MRSA PCR Screening     Status: None   Collection Time: 06/18/16  8:48 PM  Result Value Ref Range   MRSA by PCR NEGATIVE NEGATIVE    Comment:        The GeneXpert MRSA Assay (FDA approved for NASAL specimens only), is one component of a comprehensive MRSA colonization surveillance program. It is not intended to diagnose MRSA infection nor to guide or monitor treatment for MRSA infections.   Troponin I     Status: Abnormal   Collection Time: 06/18/16  8:57 PM  Result Value Ref Range   Troponin I 0.03 (HH) <0.03 ng/mL    Comment: CRITICAL RESULT CALLED TO, READ BACK BY AND VERIFIED WITH: A [facility] RN [id number] 70/96/28 A NAVARRO   Glucose, capillary     Status: Abnormal   Collection Time: 06/19/16 12:01 AM  Result Value Ref Range   Glucose-Capillary 231 (H) 65 - 99 mg/dL  CBC     Status: Abnormal   Collection Time: 06/19/16  1:57 AM  Result Value Ref Range   WBC 14.6 (H) 4.0 - 10.5 K/uL   RBC 4.42 3.87 - 5.11 MIL/uL  Hemoglobin 15.0 12.0 - 15.0 g/dL   HCT 63.9 02.2 - 80.0 %   MCV 92.8 78.0 - 100.0 fL   MCH 33.9 26.0 - 34.0 pg   MCHC 36.6 (H) 30.0 - 36.0 g/dL   RDW 36.6 47.0 - 43.5 %    Platelets 158 150 - 400 K/uL  Basic metabolic panel     Status: Abnormal   Collection Time: 06/19/16  1:57 AM  Result Value Ref Range   Sodium 131 (L) 135 - 145 mmol/L   Potassium 4.4 3.5 - 5.1 mmol/L   Chloride 101 101 - 111 mmol/L   CO2 21 (L) 22 - 32 mmol/L   Glucose, Bld 236 (H) 65 - 99 mg/dL   BUN 16 6 - 20 mg/dL   Creatinine, Ser 5.78 (H) 0.44 - 1.00 mg/dL   Calcium 8.9 8.9 - 00.6 mg/dL   GFR calc non Af Amer 29 (L) >60 mL/min   GFR calc Af Amer 34 (L) >60 mL/min    Comment: (NOTE) The eGFR has been calculated using the CKD EPI equation. This calculation has not been validated in all clinical situations. eGFR's persistently <60 mL/min signify possible Chronic Kidney Disease.    Anion gap 9 5 - 15  Troponin I     Status: Abnormal   Collection Time: 06/19/16  1:57 AM  Result Value Ref Range   Troponin I 0.09 (HH) <0.03 ng/mL    Comment: CRITICAL VALUE NOTED.  VALUE IS CONSISTENT WITH PREVIOUSLY REPORTED AND CALLED VALUE.  Protime-INR     Status: None   Collection Time: 06/19/16  1:57 AM  Result Value Ref Range   Prothrombin Time 13.5 11.4 - 15.2 seconds   INR 1.03   Albumin     Status: None   Collection Time: 06/19/16  1:57 AM  Result Value Ref Range   Albumin 3.9 3.5 - 5.0 g/dL  Glucose, capillary     Status: Abnormal   Collection Time: 06/19/16  3:39 AM  Result Value Ref Range   Glucose-Capillary 189 (H) 65 - 99 mg/dL   Comment 1 Notify RN     Dg Tibia/fibula Left  Result Date: 06/18/2016 CLINICAL DATA:  Fall, left lower leg pain EXAM: LEFT TIBIA AND FIBULA - 2 VIEW COMPARISON:  None. FINDINGS: No fracture or dislocation is seen. Mild degenerative changes with lateral compartment chondrocalcinosis. Vascular calcifications. IMPRESSION: No acute osseus abnormality is seen. Electronically Signed   By: Charline Bills M.D.   On: 06/18/2016 16:27   Ct Head Wo Contrast  Result Date: 06/18/2016 CLINICAL DATA:  Fall, hit the left side of the head EXAM: CT HEAD  WITHOUT CONTRAST CT CERVICAL SPINE WITHOUT CONTRAST TECHNIQUE: Multidetector CT imaging of the head and cervical spine was performed following the standard protocol without intravenous contrast. Multiplanar CT image reconstructions of the cervical spine were also generated. COMPARISON:  09/04/2015 FINDINGS: CT HEAD FINDINGS Brain: No intracranial hemorrhage, mass effect or midline shift. Stable cerebral atrophy. Stable extensive periventricular and patchy subcortical chronic white matter disease. No acute cortical infarction. No mass lesion is noted on this unenhanced scan. Vascular: Atherosclerotic calcifications of carotid siphon. Skull: No skull fracture is noted. Sinuses/Orbits: No paranasal sinuses air-fluid levels. Other: There is scalp swelling and subcutaneous stranding in left lateral extra orbital region and left temporal/ zygomatic region. CT CERVICAL SPINE FINDINGS Alignment: There is normal alignment. Skull base and vertebrae: No acute fracture or subluxation. Degenerative changes are noted C1-C2 articulation. There is mild anterior and mild posterior  spurring at C4-C5 and C5-C6 and C6-C7 level. Mild posterior spurring at C7-T1 level. Soft tissues and spinal canal: No prevertebral soft tissue swelling. Mild spinal canal stenosis due to posterior spurring at C4-C5 and C5-C6 level. Disc levels: Mild disc space flattening at C4-C5, C5-C6 and C6-C7 level. Upper chest: There is no pneumothorax in visualized lung apices. Other: Atherosclerotic calcifications of vertebral arteries are noted. Extensive atherosclerotic calcifications bilateral carotid bifurcation. IMPRESSION: 1. No acute intracranial abnormality. 2. Stable atrophy and extensive chronic white matter disease. No definite acute cortical infarction. 3. There is scalp swelling and subcutaneous stranding left lateral extra orbital region and left temporal/zygomatic region. Clinical correlation is necessary. 4. No cervical spine acute fracture or  subluxation. Multilevel degenerative changes as described above. Electronically Signed   By: Lahoma Crocker M.D.   On: 06/18/2016 15:22   Ct Cervical Spine Wo Contrast  Result Date: 06/18/2016 CLINICAL DATA:  Fall, hit the left side of the head EXAM: CT HEAD WITHOUT CONTRAST CT CERVICAL SPINE WITHOUT CONTRAST TECHNIQUE: Multidetector CT imaging of the head and cervical spine was performed following the standard protocol without intravenous contrast. Multiplanar CT image reconstructions of the cervical spine were also generated. COMPARISON:  09/04/2015 FINDINGS: CT HEAD FINDINGS Brain: No intracranial hemorrhage, mass effect or midline shift. Stable cerebral atrophy. Stable extensive periventricular and patchy subcortical chronic white matter disease. No acute cortical infarction. No mass lesion is noted on this unenhanced scan. Vascular: Atherosclerotic calcifications of carotid siphon. Skull: No skull fracture is noted. Sinuses/Orbits: No paranasal sinuses air-fluid levels. Other: There is scalp swelling and subcutaneous stranding in left lateral extra orbital region and left temporal/ zygomatic region. CT CERVICAL SPINE FINDINGS Alignment: There is normal alignment. Skull base and vertebrae: No acute fracture or subluxation. Degenerative changes are noted C1-C2 articulation. There is mild anterior and mild posterior spurring at C4-C5 and C5-C6 and C6-C7 level. Mild posterior spurring at C7-T1 level. Soft tissues and spinal canal: No prevertebral soft tissue swelling. Mild spinal canal stenosis due to posterior spurring at C4-C5 and C5-C6 level. Disc levels: Mild disc space flattening at C4-C5, C5-C6 and C6-C7 level. Upper chest: There is no pneumothorax in visualized lung apices. Other: Atherosclerotic calcifications of vertebral arteries are noted. Extensive atherosclerotic calcifications bilateral carotid bifurcation. IMPRESSION: 1. No acute intracranial abnormality. 2. Stable atrophy and extensive chronic  white matter disease. No definite acute cortical infarction. 3. There is scalp swelling and subcutaneous stranding left lateral extra orbital region and left temporal/zygomatic region. Clinical correlation is necessary. 4. No cervical spine acute fracture or subluxation. Multilevel degenerative changes as described above. Electronically Signed   By: Lahoma Crocker M.D.   On: 06/18/2016 15:22   Dg Chest Portable 1 View  Result Date: 06/18/2016 CLINICAL DATA:  Hip fracture.  Dementia EXAM: PORTABLE CHEST 1 VIEW COMPARISON:  03/11/2015 FINDINGS: Chronic borderline cardiomegaly, accentuated by technique and rotation. Diffuse atherosclerotic calcification of the aorta. There is no edema, consolidation, effusion, or pneumothorax. Osteopenia. No acute osseous finding. IMPRESSION: No evidence of active disease. Electronically Signed   By: Monte Fantasia M.D.   On: 06/18/2016 16:44   Dg Hip Unilat With Pelvis 2-3 Views Left  Result Date: 06/18/2016 CLINICAL DATA:  Fall, left hip pain EXAM: DG HIP (WITH OR WITHOUT PELVIS) 2-3V LEFT COMPARISON:  None. FINDINGS: Subcapital left hip fracture. Visualized bony pelvis appears intact. Bilobed hip joint spaces are preserved. IMPRESSION: Subcapital left hip fracture. Electronically Signed   By: Julian Hy M.D.   On: 06/18/2016 16:09  Review of Systems  Constitutional: Negative.   HENT: Negative.   Eyes: Negative.   Respiratory: Negative.   Cardiovascular: Negative.   Gastrointestinal: Positive for heartburn.  Genitourinary: Negative.   Musculoskeletal: Positive for joint pain.  Skin: Negative.   Neurological: Negative.   Endo/Heme/Allergies: Negative.   Psychiatric/Behavioral: Positive for memory loss.   Blood pressure 120/63, pulse 75, temperature 98.6 F (37 C), temperature source Oral, resp. rate 17, height _0  (1.549 m), weight 58 kg (127 lb 13.9 oz), SpO2 100 %. Physical Exam  Constitutional: She appears well-developed and well-nourished.   HENT:  Head: Normocephalic.  Eyes: Pupils are equal, round, and reactive to light.  Neck: Neck supple. No JVD present. No tracheal deviation present. No thyromegaly present.  Cardiovascular: Normal rate and intact distal pulses.   Respiratory: Effort normal and breath sounds normal. No respiratory distress. She has no wheezes.  GI: Soft. There is no tenderness. There is no guarding.  Musculoskeletal:       Left hip: She exhibits decreased range of motion, decreased strength, tenderness and bony tenderness. She exhibits no swelling, no deformity and no laceration.  Lymphadenopathy:    She has no cervical adenopathy.  Neurological: She is alert. She is disoriented.  Skin: Skin is warm and dry.  Psychiatric: She has a normal mood and affect. Cognition and memory are impaired.      Assessment/Plan:  Subcapital left hip fracture   NPO after midnight Dr. Lyla Glassing is planning surgery tomorrow. He has already placed a order to obtain consent for a left hip hemiarthroplasty.       Pricilla Loveless 06/19/2016, 8:08 AM

## 2016-06-20 NOTE — Anesthesia Procedure Notes (Addendum)
Spinal  Patient location during procedure: OR Start time: 06/20/2016 8:41 AM Staffing Anesthesiologist: Montez Hageman Performed: anesthesiologist  Preanesthetic Checklist Completed: patient identified, site marked, surgical consent, pre-op evaluation, timeout performed, IV checked, risks and benefits discussed and monitors and equipment checked Spinal Block Patient position: sitting Prep: Betadine Patient monitoring: heart rate, continuous pulse ox and blood pressure Approach: right paramedian Location: L2-3 Injection technique: single-shot Needle Needle type: Spinocan  Needle gauge: 22 G Needle length: 9 cm Additional Notes Expiration date of kit checked and confirmed. Patient tolerated procedure well, without complications.

## 2016-06-20 NOTE — Anesthesia Procedure Notes (Signed)
Procedure Name: MAC Date/Time: 06/20/2016 8:23 AM Performed by: Carleene Cooper A Pre-anesthesia Checklist: Patient identified, Timeout performed, Emergency Drugs available, Suction available and Patient being monitored Patient Re-evaluated:Patient Re-evaluated prior to inductionOxygen Delivery Method: Simple face mask Dental Injury: Teeth and Oropharynx as per pre-operative assessment

## 2016-06-21 ENCOUNTER — Inpatient Hospital Stay (HOSPITAL_COMMUNITY): Payer: Commercial Managed Care - HMO

## 2016-06-21 DIAGNOSIS — S72012D Unspecified intracapsular fracture of left femur, subsequent encounter for closed fracture with routine healing: Secondary | ICD-10-CM

## 2016-06-21 DIAGNOSIS — E876 Hypokalemia: Secondary | ICD-10-CM | POA: Clinically undetermined

## 2016-06-21 LAB — RETICULOCYTES
RBC.: 3.78 MIL/uL — ABNORMAL LOW (ref 3.87–5.11)
RETIC COUNT ABSOLUTE: 86.9 10*3/uL (ref 19.0–186.0)
Retic Ct Pct: 2.3 % (ref 0.4–3.1)

## 2016-06-21 LAB — IRON AND TIBC
IRON: 24 ug/dL — AB (ref 28–170)
SATURATION RATIOS: 10 % — AB (ref 10.4–31.8)
TIBC: 248 ug/dL — ABNORMAL LOW (ref 250–450)
UIBC: 224 ug/dL

## 2016-06-21 LAB — URINE CULTURE

## 2016-06-21 LAB — CBC
HCT: 27.5 % — ABNORMAL LOW (ref 36.0–46.0)
Hemoglobin: 9.7 g/dL — ABNORMAL LOW (ref 12.0–15.0)
MCH: 34.2 pg — AB (ref 26.0–34.0)
MCHC: 35.3 g/dL (ref 30.0–36.0)
MCV: 96.8 fL (ref 78.0–100.0)
PLATELETS: 106 10*3/uL — AB (ref 150–400)
RBC: 2.84 MIL/uL — ABNORMAL LOW (ref 3.87–5.11)
RDW: 13.5 % (ref 11.5–15.5)
WBC: 8.7 10*3/uL (ref 4.0–10.5)

## 2016-06-21 LAB — BASIC METABOLIC PANEL
Anion gap: 5 (ref 5–15)
BUN: 15 mg/dL (ref 6–20)
CALCIUM: 6.4 mg/dL — AB (ref 8.9–10.3)
CO2: 18 mmol/L — ABNORMAL LOW (ref 22–32)
CREATININE: 0.92 mg/dL (ref 0.44–1.00)
Chloride: 114 mmol/L — ABNORMAL HIGH (ref 101–111)
GFR calc Af Amer: 59 mL/min — ABNORMAL LOW (ref >60)
GFR, EST NON AFRICAN AMERICAN: 51 mL/min — AB (ref >60)
GLUCOSE: 127 mg/dL — AB (ref 65–99)
POTASSIUM: 3.1 mmol/L — AB (ref 3.5–5.1)
Sodium: 137 mmol/L (ref 135–145)

## 2016-06-21 LAB — GLUCOSE, CAPILLARY
GLUCOSE-CAPILLARY: 211 mg/dL — AB (ref 65–99)
GLUCOSE-CAPILLARY: 244 mg/dL — AB (ref 65–99)
GLUCOSE-CAPILLARY: 178 mg/dL — AB (ref 65–99)
Glucose-Capillary: 123 mg/dL — ABNORMAL HIGH (ref 65–99)
Glucose-Capillary: 123 mg/dL — ABNORMAL HIGH (ref 65–99)
Glucose-Capillary: 208 mg/dL — ABNORMAL HIGH (ref 65–99)

## 2016-06-21 LAB — HEPATIC FUNCTION PANEL
ALBUMIN: 2.9 g/dL — AB (ref 3.5–5.0)
ALK PHOS: 46 U/L (ref 38–126)
ALT: 12 U/L — AB (ref 14–54)
AST: 24 U/L (ref 15–41)
BILIRUBIN TOTAL: 0.4 mg/dL (ref 0.3–1.2)
Bilirubin, Direct: 0.1 mg/dL (ref 0.1–0.5)
Indirect Bilirubin: 0.3 mg/dL (ref 0.3–0.9)
Total Protein: 6 g/dL — ABNORMAL LOW (ref 6.5–8.1)

## 2016-06-21 LAB — FOLATE: Folate: 2.9 ng/mL — ABNORMAL LOW (ref >5.9)

## 2016-06-21 LAB — FERRITIN: Ferritin: 191 ng/mL (ref 11–307)

## 2016-06-21 LAB — VITAMIN B12: Vitamin B-12: 122 pg/mL — ABNORMAL LOW (ref 180–914)

## 2016-06-21 LAB — PHOSPHORUS: PHOSPHORUS: 2.5 mg/dL (ref 2.5–4.6)

## 2016-06-21 LAB — MAGNESIUM: Magnesium: 1.6 mg/dL — ABNORMAL LOW (ref 1.7–2.4)

## 2016-06-21 MED ORDER — CEFUROXIME AXETIL 250 MG PO TABS
250.0000 mg | ORAL_TABLET | Freq: Two times a day (BID) | ORAL | Status: DC
  Administered 2016-06-21 – 2016-06-25 (×8): 250 mg via ORAL
  Filled 2016-06-21 (×9): qty 1

## 2016-06-21 MED ORDER — CALCIUM CITRATE 950 (200 CA) MG PO TABS
400.0000 mg | ORAL_TABLET | Freq: Four times a day (QID) | ORAL | Status: DC
  Administered 2016-06-21 – 2016-06-24 (×13): 400 mg via ORAL
  Filled 2016-06-21: qty 2
  Filled 2016-06-21: qty 1
  Filled 2016-06-21 (×18): qty 2

## 2016-06-21 MED ORDER — DEXTROSE 5 % IV SOLN
3.0000 g | Freq: Once | INTRAVENOUS | Status: AC
  Administered 2016-06-21: 3 g via INTRAVENOUS
  Filled 2016-06-21: qty 6

## 2016-06-21 MED ORDER — SODIUM CHLORIDE 0.9 % IV SOLN
1.0000 g | Freq: Once | INTRAVENOUS | Status: AC
  Administered 2016-06-21: 1 g via INTRAVENOUS
  Filled 2016-06-21: qty 10

## 2016-06-21 MED ORDER — SODIUM CHLORIDE 0.9% FLUSH
10.0000 mL | INTRAVENOUS | Status: DC | PRN
  Administered 2016-06-23: 20 mL
  Filled 2016-06-21: qty 40

## 2016-06-21 MED ORDER — POTASSIUM CHLORIDE 20 MEQ/15ML (10%) PO SOLN
40.0000 meq | ORAL | Status: AC
  Administered 2016-06-21 (×2): 40 meq via ORAL
  Filled 2016-06-21 (×2): qty 30

## 2016-06-21 MED ORDER — CYANOCOBALAMIN 1000 MCG/ML IJ SOLN
1000.0000 ug | Freq: Every day | INTRAMUSCULAR | Status: DC
  Administered 2016-06-21 – 2016-06-23 (×3): 1000 ug via INTRAMUSCULAR
  Filled 2016-06-21 (×4): qty 1

## 2016-06-21 MED ORDER — SODIUM CHLORIDE 0.9% FLUSH
10.0000 mL | Freq: Two times a day (BID) | INTRAVENOUS | Status: DC
  Administered 2016-06-22 – 2016-06-24 (×3): 10 mL
  Administered 2016-06-24: 20 mL

## 2016-06-21 MED ORDER — POTASSIUM CHLORIDE 20 MEQ PO PACK
40.0000 meq | PACK | ORAL | Status: DC
  Filled 2016-06-21: qty 2

## 2016-06-21 MED ORDER — STERILE WATER FOR INJECTION IV SOLN
INTRAVENOUS | Status: AC
  Administered 2016-06-21 – 2016-06-22 (×2): via INTRAVENOUS
  Filled 2016-06-21 (×5): qty 850

## 2016-06-21 MED ORDER — CALCIUM CITRATE 950 (200 CA) MG PO TABS: 500.0000 mg | ORAL_TABLET | Freq: Four times a day (QID) | ORAL | Status: DC

## 2016-06-21 NOTE — Progress Notes (Signed)
Patient Name: Brittany Chambers Doctor Date of Encounter: 06/21/2016  Primary Cardiologist: Dr. Mertie Moores  Subjective   Resting. Alert to person only at baseline.  Inpatient Medications    Scheduled Meds: . ALPRAZolam  1 mg Oral QHS  . apixaban  2.5 mg Oral BID  . cefTRIAXone (ROCEPHIN)  IV  1 g Intravenous Q24H  . cholecalciferol  1,000 Units Oral Daily  . famotidine  10 mg Oral Daily  . insulin aspart  0-9 Units Subcutaneous Q4H  . insulin glargine  5 Units Subcutaneous Daily  . levothyroxine  100 mcg Oral Once per day on Sun Tue Thu Fri Sat  . loratadine  10 mg Oral Daily  . traMADol-acetaminophen  1 tablet Oral BID   Continuous Infusions: . sodium chloride 100 mL/hr at 06/21/16 0059  . diltiazem (CARDIZEM) infusion 5 mg/hr (06/21/16 0059)   PRN Meds: acetaminophen **OR** acetaminophen, bisacodyl, HYDROcodone-acetaminophen, menthol-cetylpyridinium **OR** phenol, methocarbamol **OR** methocarbamol (ROBAXIN)  IV, metoCLOPramide **OR** metoCLOPramide (REGLAN) injection, morphine injection, ondansetron **OR** ondansetron (ZOFRAN) IV, polyethylene glycol   Vital Signs    Vitals:   06/21/16 0300 06/21/16 0400 06/21/16 0500 06/21/16 0600  BP: (!) 92/47 (!) 122/57 (!) 96/49 131/66  Pulse: 76 76 76 76  Resp: 13 14 15 12   Temp:  97 F (36.1 C)    TempSrc:  Oral    SpO2: 100% 99% 99% 99%  Weight:      Height:        Intake/Output Summary (Last 24 hours) at 06/21/16 0717 Last data filed at 06/21/16 0545  Gross per 24 hour  Intake          3205.33 ml  Output              600 ml  Net          2605.33 ml   Filed Weights   06/18/16 2200  Weight: 127 lb 13.9 oz (58 kg)    Physical Exam   Gen.: Frail appearing elderly woman. HEENT: Contusion by Steri-Strips left orbital area.. Neck: Supple, no elevated JVP or carotid bruits. Lungs: Clear to auscultation, nonlabored breathing at rest. Cardiac: Irregularly irregular, no S3, soft systolic murmur. Abdomen: Soft,  nontender, bowel sounds present. Extremities: No pitting edema, distal pulses 2+.  Labs    CBC  Recent Labs  06/18/16 1434  06/20/16 0405 06/21/16 0339  WBC 7.4  < > 13.2* 8.7  NEUTROABS 5.3  --  11.4*  --   HGB 15.8*  < > 14.3 9.7*  HCT 45.5  < > 40.2 27.5*  MCV 95.0  < > 95.7 96.8  PLT 172  < > 174 106*  < > = values in this interval not displayed. Basic Metabolic Panel  Recent Labs  06/20/16 0405 06/21/16 0339  NA 134* 137  K 4.4 3.1*  CL 104 114*  CO2 20* 18*  GLUCOSE 191* 127*  BUN 16 15  CREATININE 1.19* 0.92  CALCIUM 9.3 6.4*   Liver Function Tests  Recent Labs  06/19/16 0157  ALBUMIN 3.9   Cardiac Enzymes  Recent Labs  06/19/16 0806 06/19/16 1345 06/19/16 2008  TROPONINI 0.11* 0.11* 0.10*   Hemoglobin A1C  Recent Labs  06/18/16 2057  HGBA1C 6.5*   Thyroid Function Tests  Recent Labs  06/18/16 1649  TSH 5.188*    Telemetry    I personally reviewed telemetry monitoring which shows rate-controlled atrial flutter with variable conduction.  Radiology    Pelvis Portable  Result Date:  06/20/2016 CLINICAL DATA:  Postoperative hemi left hip replacement. EXAM: PORTABLE PELVIS 1-2 VIEWS COMPARISON:  Abdominal CT 07/24/2015 FINDINGS: Status post left hip hemiarthroplasty with normal alignment of the prosthetic left femoral head with the acetabulum. Expected postsurgical changes. No evidence of fracture. IMPRESSION: Status post left hip hemiarthroplasty without evidence of immediate complications. Electronically Signed   By: Fidela Salisbury M.D.   On: 06/20/2016 11:20   Dg C-arm 1-60 Min-no Report  Result Date: 06/20/2016 There is no Radiologist interpretation  for this exam.  Dg Hip Operative Unilat W Or W/o Pelvis Left  Result Date: 06/20/2016 CLINICAL DATA:  Left hip arthroplasty post femoral neck fracture. EXAM: OPERATIVE left HIP (WITH PELVIS IF PERFORMED) 3 VIEWS TECHNIQUE: Fluoroscopic spot image(s) were submitted for  interpretation post-operatively. COMPARISON:  06/18/2016 and 06/20/2016 FINDINGS: There has been placement of a left hip arthroplasty intact and normally located. Recommend correlation with findings at the time of the procedure. IMPRESSION: Left hip arthroplasty intact and normally located. Electronically Signed   By: Marin Olp M.D.   On: 06/20/2016 11:34    Cardiac Studies   Echocardiogram pending.  Patient Profile     82 year old woman with history of dementia, diabetes mellitus, GERD, hypothyroidism, presenting status post fall with left hip fracture.She underwent left hip hemiarthroplasty on December 23. Seen for preoperative cardiology consultation by Dr. Acie Fredrickson.  Assessment & Plan    1. Atypical atrial flutter with variable conduction, duration uncertain. Heart rate is better controlled on intravenous diltiazem. CHADSVASC score is 4, felt not to be optimal long-term anticoagulation candidate at this point (see consult recommendations).  2. Postoperative day 2 status post left hip hemiarthroplasty.Started on Eliquis for limited course.  Heart rate adequately controlled at this time on intravenous diltiazem. Would continue through today and depending on how she does clinically anticipate transition to oral regimen in the next 24 hours focusing on heart rate control strategy.  Signed, Satira Sark, M.D., F.A.C.C.  06/21/2016, 7:17 AM

## 2016-06-21 NOTE — Progress Notes (Signed)
Peripherally Inserted Central Catheter/Midline Placement  The IV Nurse has discussed with the patient and/or persons authorized to consent for the patient, the purpose of this procedure and the potential benefits and risks involved with this procedure.  The benefits include less needle sticks, lab draws from the catheter, and the patient may be discharged home with the catheter. Risks include, but not limited to, infection, bleeding, blood clot (thrombus formation), and puncture of an artery; nerve damage and irregular heartbeat and possibility to perform a PICC exchange if needed/ordered by physician.  Alternatives to this procedure were also discussed.  Bard Power PICC patient education guide, fact sheet on infection prevention and patient information card has been provided to patient /or left at bedside.    PICC/Midline Placement Documentation     Telephone consent by Daughter, Merla Riches Albarece 06/21/2016, 4:13 PM

## 2016-06-21 NOTE — Progress Notes (Signed)
PROGRESS NOTE    Brittany Chambers  [id number] DOB: Aug 13, 1918 DOA: 06/18/2016 PCP: Leeroy Cha, MD    Brief Narrative:  Patient is a 82 year old lady history of dementia, recurrent cataracts, gastroesophageal reflux disease, hypothyroidism, failure to thrive who presented to the ED after a fall while using the bathroom. Patient noted to have a left hip fracture. Patient also noted to be significantly hypotensive and in a flutter with RVR. First set of troponins mildly elevated. Patient placed on a Cardizem drip in place in the step down unit. Cardiology consulted for preop clearance and management of A. fib and elevated troponins. Patient s/p hip repair 06/20/2016.   Assessment & Plan:   Principal Problem:   Closed left hip fracture (HCC) Active Problems:   Atrial flutter with rapid ventricular response (HCC)   UTI (urinary tract infection)   Dementia   Hypothyroidism   Hyperglycemia   DM (diabetes mellitus), type 2 (HCC)   Fall at home   RBBB   Elevated troponin   Closed subcapital fracture of left femur (HCC)   Left displaced femoral neck fracture (HCC)   Hypocalcemia   Hypokalemia  #1 closed left hip fracture Likely secondary to mechanical fall patient sustained while going to the bathroom. Patient has been seen in consultation by orthopedics and patient status post left hip hemiarthroplasty 06/20/2016 per Dr. Lyla Glassing. 2-D echo pending. Not done prior to surgery as it was felt that there would be difficulty obtaining good images and very difficult to turn patient without causing significant pain. Per cardiology feels patient has good LV function based on normal blood pressure, heart rate and good pulses and feel not necessary to obtain echo prior to surgery. Patient was seen by cardiology preoperatively. Per orthopedics, patient to be started eliquis for DVT prophylaxis x 1 month.  Orthopedics following.  #2 atrial flutter with RVR CHA2DS2VASC = 4 Patient noted  to be in a flutter with RVR on admission and placed on a Cardizem drip currently now with rate control. Cardiology recommended addition of low-dose beta blocker which was started last evening with Coreg 3.125 mg twice a day. Coreg was subsequently discontinued secondary to borderline blood pressure with systolic blood pressures in the 90s. Patient not on anticoagulation and patient high risk for falls and as such likely not a good long-term candidate for chronic anticoagulation. Per cardiology continue Cardizem drip for another 24 hours and hopefully transition to oral Cardizem tomorrow. Cardiology following and appreciate input and recommendations.  #3 dementia Stable.  #4 Escherichia coli urinary tract infection Patient afebrile. WBC trending down. Discontinue IV Rocephin. Place on oral Ceftin to complete course of antibiotic treatment. Was antibiotic treatment has been completed will resume patient's prophylactic antibiotics.   #5 diabetes mellitus type 2 Blood glucose in the 200s on admission. CBGs currently ranging from 109-123. Continue low-dose Lantus 5 units daily. Continue sliding scale insulin.  #6 elevated troponin May be secondary to demand ischemia secondary to fall versus NSTEMI. Cardiac enzymes mildly elevated to 0.11 which seems to have plateaued. Patient denied any chest pain. 2-D echo pending. Patient was started on Coreg however due to systolic blood pressures in the 80s and 90s Coreg was discontinued. Continue current Cardizem drip and per cardiology may consider transitioning to oral Cardizem in 24 hours.. Cardiology following and appreciate input and recommendations.  #7 hypothyroidism TSH at 5.188. Patient was noted to be taking Synthroid daily except on 2 days during the week. Continue Synthroid on a daily basis and will need  repeat thyroid function studies done in about 4-6 weeks.  #8 right bundle branch block  #9 Fall Questionable etiology. Mechanical versus secondary  to UTI versus secondary to elevated troponins. Urine cultures pending. Patient on empiric IV antibiotics. Cardiac enzymes elevated up to 0.11 which seems to have plateaued.  2-D echo pending. Cardiology following. PT/OT postoperatively or when okay with orthopedics.  #10 hypokalemia Check a magnesium level. Replete.  #11 anemia/acute blood loss anemia Patient with no overt bleeding. Hemoglobin currently at 9.7 from 14.3 on admission. Likely postop acute blood loss anemia. Check an anemia panel. Follow H&H. Transfusion threshold hemoglobin less than 7.  #12 hypocalcemia Questionable etiology. Vitamin D levels on the low end of normal concern for vitamin D deficiency. Check a PTH, intact calcium, magnesium, phosphorus level. Check a hepatic panel. We'll place on calcium citrate 400 mg 4 times daily and vitamin D 1000 international units needs daily.    DVT prophylaxis: SCDs Code Status: DO NOT RESUSCITATE Family Communication: Updated patient and son at bedside. Disposition Plan: Remain the step down unit. Likely skilled nursing facility versus home with home health postoperatively and per orthopedics.   Consultants:   Orthopedics: Dr. Alvan Dame 06/18/2016  Cardiology: Dr. Acie Fredrickson 06/19/2016  Procedures:   Chest x-ray 06/18/2016  Plain films of the left hip 06/18/2016  Plain. The left tib-fib 06/18/2016  Left hip hemiarthroplasty 06/20/2016 per Dr.Swinteck  Antimicrobials:   IV Rocephin 06/18/2016>>>>>06/21/2016  Oral Ceftin 06/21/2016   Subjective: Patient laying pleasantly confused. Patient denies any shortness of breath. Patient denies any chest pain.   Objective: Vitals:   06/21/16 0600 06/21/16 0700 06/21/16 0800 06/21/16 0900  BP: 131/66 124/74 128/65 (!) 118/41  Pulse: 76 75 76 93  Resp: 12 13 15 18   Temp:      TempSrc:      SpO2: 99% 99% 96% 94%  Weight:      Height:        Intake/Output Summary (Last 24 hours) at 06/21/16 1001 Last data filed at 06/21/16  0545  Gross per 24 hour  Intake          3205.33 ml  Output              350 ml  Net          2855.33 ml   Filed Weights   06/18/16 2200  Weight: 58 kg (127 lb 13.9 oz)    Examination:  General exam: In bed. Respiratory system: Clear to auscultation anterior lung fields.Marland Kitchen Respiratory effort normal. Cardiovascular system: Irregularly irregular. No JVD, murmurs, rubs, gallops or clicks. No pedal edema. Gastrointestinal system: Abdomen is nondistended, soft and nontender. No organomegaly or masses felt. Normal bowel sounds heard. Central nervous system: Alert. Disoriented. No focal neurological deficits. Extremities: No c/c/e Skin: No rashes, lesions or ulcers Psychiatry: Judgement and insight poor-fair. Mood & affect appropriate.     Data Reviewed: I have personally reviewed following labs and imaging studies  CBC:  Recent Labs Lab 06/18/16 1434 06/19/16 0157 06/20/16 0405 06/21/16 0339  WBC 7.4 14.6* 13.2* 8.7  NEUTROABS 5.3  --  11.4*  --   HGB 15.8* 15.0 14.3 9.7*  HCT 45.5 41.0 40.2 27.5*  MCV 95.0 92.8 95.7 96.8  PLT 172 158 174 [id number]*   Basic Metabolic Panel:  Recent Labs Lab 06/18/16 1434 06/19/16 0157 06/20/16 0405 06/21/16 0339  NA 133* 131* 134* 137  K 4.0 4.4 4.4 3.1*  CL 97* 101 104 114*  CO2 23 21* 20* 18*  GLUCOSE 219* 236* 191* 127*  BUN 14 16 16 15   CREATININE 1.57* 1.46* 1.19* 0.92  CALCIUM 9.7 8.9 9.3 6.4*   GFR: Estimated Creatinine Clearance: 30 mL/min (by C-G formula based on SCr of 0.92 mg/dL). Liver Function Tests:  Recent Labs Lab 06/19/16 0157  ALBUMIN 3.9   No results for input(s): LIPASE, AMYLASE in the last 168 hours. No results for input(s): AMMONIA in the last 168 hours. Coagulation Profile:  Recent Labs Lab 06/18/16 1434 06/19/16 0157  INR 0.98 1.03   Cardiac Enzymes:  Recent Labs Lab 06/18/16 2057 06/19/16 0157 06/19/16 0806 06/19/16 1345 06/19/16 2008  TROPONINI 0.03* 0.09* 0.11* 0.11* 0.10*   BNP  (last 3 results) No results for input(s): PROBNP in the last 8760 hours. HbA1C:  Recent Labs  06/18/16 2057  HGBA1C 6.5*   CBG:  Recent Labs Lab 06/20/16 1622 06/20/16 2032 06/20/16 2345 06/21/16 0342 06/21/16 0801  GLUCAP 252* 140* 109* 123* 123*   Lipid Profile: No results for input(s): CHOL, HDL, LDLCALC, TRIG, CHOLHDL, LDLDIRECT in the last 72 hours. Thyroid Function Tests:  Recent Labs  06/18/16 1649  TSH 5.188*   Anemia Panel:  Recent Labs  06/21/16 0857  RETICCTPCT 2.3   Sepsis Labs:  Recent Labs Lab 06/18/16 1658  LATICACIDVEN 1.75    Recent Results (from the past 240 hour(s))  Urine culture     Status: Abnormal   Collection Time: 06/18/16  6:10 PM  Result Value Ref Range Status   Specimen Description URINE, RANDOM  Final   Special Requests NONE  Final   Culture >=100,000 COLONIES/mL ESCHERICHIA COLI (A)  Final   Report Status 06/21/2016 FINAL  Final   Organism ID, Bacteria ESCHERICHIA COLI (A)  Final      Susceptibility   Escherichia coli - MIC*    AMPICILLIN >=32 RESISTANT Resistant     CEFAZOLIN <=4 SENSITIVE Sensitive     CEFTRIAXONE <=1 SENSITIVE Sensitive     CIPROFLOXACIN <=0.25 SENSITIVE Sensitive     GENTAMICIN <=1 SENSITIVE Sensitive     IMIPENEM <=0.25 SENSITIVE Sensitive     NITROFURANTOIN 64 INTERMEDIATE Intermediate     TRIMETH/SULFA >=320 RESISTANT Resistant     AMPICILLIN/SULBACTAM 16 INTERMEDIATE Intermediate     PIP/TAZO <=4 SENSITIVE Sensitive     Extended ESBL NEGATIVE Sensitive     * >=100,000 COLONIES/mL ESCHERICHIA COLI  MRSA PCR Screening     Status: None   Collection Time: 06/18/16  8:48 PM  Result Value Ref Range Status   MRSA by PCR NEGATIVE NEGATIVE Final    Comment:        The GeneXpert MRSA Assay (FDA approved for NASAL specimens only), is one component of a comprehensive MRSA colonization surveillance program. It is not intended to diagnose MRSA infection nor to guide or monitor treatment for MRSA  infections.          Radiology Studies: Pelvis Portable  Result Date: 06/20/2016 CLINICAL DATA:  Postoperative hemi left hip replacement. EXAM: PORTABLE PELVIS 1-2 VIEWS COMPARISON:  Abdominal CT 07/24/2015 FINDINGS: Status post left hip hemiarthroplasty with normal alignment of the prosthetic left femoral head with the acetabulum. Expected postsurgical changes. No evidence of fracture. IMPRESSION: Status post left hip hemiarthroplasty without evidence of immediate complications. Electronically Signed   By: Fidela Salisbury M.D.   On: 06/20/2016 11:20   Dg C-arm 1-60 Min-no Report  Result Date: 06/20/2016 There is no Radiologist interpretation  for this exam.  Dg Hip Operative Unilat W Or W/o  Pelvis Left  Result Date: 06/20/2016 CLINICAL DATA:  Left hip arthroplasty post femoral neck fracture. EXAM: OPERATIVE left HIP (WITH PELVIS IF PERFORMED) 3 VIEWS TECHNIQUE: Fluoroscopic spot image(s) were submitted for interpretation post-operatively. COMPARISON:  06/18/2016 and 06/20/2016 FINDINGS: There has been placement of a left hip arthroplasty intact and normally located. Recommend correlation with findings at the time of the procedure. IMPRESSION: Left hip arthroplasty intact and normally located. Electronically Signed   By: Marin Olp M.D.   On: 06/20/2016 11:34        Scheduled Meds: . ALPRAZolam  1 mg Oral QHS  . apixaban  2.5 mg Oral BID  . calcium citrate  400 mg of elemental calcium Oral QID  . cefUROXime  250 mg Oral BID WC  . cholecalciferol  1,000 Units Oral Daily  . famotidine  10 mg Oral Daily  . insulin aspart  0-9 Units Subcutaneous Q4H  . insulin glargine  5 Units Subcutaneous Daily  . levothyroxine  100 mcg Oral Once per day on Sun Tue Thu Fri Sat  . loratadine  10 mg Oral Daily  . potassium chloride  40 mEq Oral Q4H  . traMADol-acetaminophen  1 tablet Oral BID   Continuous Infusions: . diltiazem (CARDIZEM) infusion 5 mg/hr (06/21/16 0059)  .  sodium  bicarbonate 150 mEq in sterile water 1000 mL infusion 75 mL/hr at 06/21/16 0918     LOS: 3 days    Time spent: 3 mins    THOMPSON,DANIEL, MD [facility] Pager [phone]  If 7PM-7AM, please contact night-coverage [email or web address] Password [email or web address] 06/21/2016, 10:01 AM

## 2016-06-21 NOTE — Progress Notes (Addendum)
   Subjective:  Patient is sleeping and not responding to questions.  Per her daughter she is doing well.  Objective:   VITALS:   Vitals:   06/21/16 0300 06/21/16 0400 06/21/16 0500 06/21/16 0600  BP: (!) 92/47 (!) 122/57 (!) 96/49 131/66  Pulse: 76 76 76 76  Resp: 13 14 15 12   Temp:  97 F (36.1 C)    TempSrc:  Oral    SpO2: 100% 99% 99% 99%  Weight:      Height:        Intact pulses distally Incision: dressing C/D/I Moves spontaneously  Lab Results  Component Value Date   WBC 8.7 06/21/2016   HGB 9.7 (L) 06/21/2016   HCT 27.5 (L) 06/21/2016   MCV 96.8 06/21/2016   PLT 106 (L) 06/21/2016   BMET    Component Value Date/Time   NA 137 06/21/2016 0339   K 3.1 (L) 06/21/2016 0339   CL 114 (H) 06/21/2016 0339   CO2 18 (L) 06/21/2016 0339   GLUCOSE 127 (H) 06/21/2016 0339   BUN 15 06/21/2016 0339   CREATININE 0.92 06/21/2016 0339   CALCIUM 6.4 (LL) 06/21/2016 0339   GFRNONAA 51 (L) 06/21/2016 0339   GFRAA 59 (L) 06/21/2016 0339     Assessment/Plan: 1 Day Post-Op   Principal Problem:   Closed left hip fracture (HCC) Active Problems:   UTI (urinary tract infection)   Atrial flutter with rapid ventricular response (HCC)   Dementia   Hypothyroidism   Hyperglycemia   DM (diabetes mellitus), type 2 ([place])   Fall at home   RBBB   Elevated troponin   Closed subcapital fracture of left femur (HCC)   Left displaced femoral neck fracture (HCC)   Advance diet Up with therapy WBAT to RLE Maintain dressing. Eliquis bid for dvt ppx   Nicholes Stairs 06/21/2016, 8:01 AM   Geralynn Rile, MD [phone]

## 2016-06-21 NOTE — Evaluation (Signed)
Physical Therapy Evaluation Patient Details Name: Brittany Chambers MRN: [id number] DOB: [birth year] Today's Date: 06/21/2016   History of Present Illness  Patient is a 82 year old lady history of dementia,  hypothyroidism, failure to thrive who presented to the ED after a fall while using the bathroom. Patient noted to have a left hip fracture. Patient also noted to be significantly hypotensive and in a flutter with RVR. First set of troponins mildly elevated. Patient placed on a Cardizem drip in place in the step down unit. Cardiology consulted for preop clearance and management of Afib and elevated troponins. Patient s/p DA L hip hemi 06/20/2016.  Clinical Impression  Pt admitted with above diagnosis. Pt currently with functional limitations due to the deficits listed below (see PT Problem List). * Pt will benefit from skilled PT to increase their independence and safety with mobility to allow discharge to the venue listed below.   Pt will benefit from SNF post acute, VSS duirng PT, HR controlled in 90s on IV cardizem drip, BP 109/49     Follow Up Recommendations SNF    Equipment Recommendations  None recommended by PT    Recommendations for Other Services       Precautions / Restrictions Precautions Precautions: Fall Restrictions LLE Weight Bearing: Weight bearing as tolerated      Mobility  Bed Mobility Overal bed mobility: Needs Assistance Bed Mobility: Supine to Sit     Supine to sit: Mod assist;+2 for safety/equipment;+2 for physical assistance     General bed mobility comments: incr time, cues to self assist; assist with LLE and trunk  Transfers Overall transfer level: Needs assistance Equipment used: Rolling walker (2 wheeled) Transfers: Stand Pivot Transfers Sit to Stand: Min assist;+2 physical assistance Stand pivot transfers: Mod assist;+2 physical assistance;+2 safety/equipment       General transfer comment: sit to stand x 2 for instruction and activity  tolerance; pt stated she was "too tired" to get to chair but agreeable to stand pivot   Ambulation/Gait                Stairs            Wheelchair Mobility    Modified Rankin (Stroke Patients Only)       Balance Overall balance assessment: Needs assistance;History of Falls Sitting-balance support: Bilateral upper extremity supported;Feet supported Sitting balance-Leahy Scale: Fair     Standing balance support: During functional activity;Bilateral upper extremity supported Standing balance-Leahy Scale: Zero                               Pertinent Vitals/Pain Pain Assessment: Faces Faces Pain Scale: Hurts little more Pain Location: L hip Pain Descriptors / Indicators: Grimacing Pain Intervention(s): Limited activity within patient's tolerance;Monitored during session;Premedicated before session    Home Living Family/patient expects to be discharged to:: Skilled nursing facility (likely SNF per pt son) Living Arrangements: Children               Additional Comments: pt lives with dtr--not present for EVAL    Prior Function Level of Independence: Independent with assistive device(s);Needs assistance   Gait / Transfers Assistance Needed: amb with RW (shuffles per pt son)  ADL's / Homemaking Assistance Needed: ?dtr assisiting-son isn't sure        Hand Dominance        Extremity/Trunk Assessment   Upper Extremity Assessment Upper Extremity Assessment: Generalized weakness    Lower Extremity Assessment Lower  Extremity Assessment: LLE deficits/detail;Generalized weakness LLE Deficits / Details: AAROM grossly WFL;  LLE: Unable to fully assess due to pain       Communication   Communication: No difficulties;HOH  Cognition Arousal/Alertness: Awake/alert Behavior During Therapy: WFL for tasks assessed/performed Overall Cognitive Status: History of cognitive impairments - at baseline                      General Comments       Exercises     Assessment/Plan    PT Assessment Patient needs continued PT services  PT Problem List Decreased strength;Decreased range of motion;Decreased activity tolerance;Decreased balance;Decreased cognition;Decreased mobility          PT Treatment Interventions DME instruction;Gait training;Functional mobility training;Therapeutic activities;Therapeutic exercise;Patient/family education    PT Goals (Current goals can be found in the Care Plan section)  Acute Rehab PT Goals PT Goal Formulation: Patient unable to participate in goal setting Time For Goal Achievement: 06/30/16 Potential to Achieve Goals: Good    Frequency Min 3X/week   Barriers to discharge        Co-evaluation               End of Session Equipment Utilized During Treatment: Gait belt Activity Tolerance: Patient tolerated treatment well;Patient limited by fatigue Patient left: in chair;with call bell/phone within reach;with family/visitor present;with chair alarm set Nurse Communication: Mobility status         Time: [id number] PT Time Calculation (min) (ACUTE ONLY): 24 min   Charges:   PT Evaluation $PT Eval Low Complexity: 1 Procedure PT Treatments $Therapeutic Activity: 8-22 mins   PT G Codes:        Brittany Chambers 06-26-2016, 12:16 PM

## 2016-06-22 ENCOUNTER — Inpatient Hospital Stay (HOSPITAL_COMMUNITY): Payer: Commercial Managed Care - HMO

## 2016-06-22 DIAGNOSIS — D519 Vitamin B12 deficiency anemia, unspecified: Secondary | ICD-10-CM | POA: Diagnosis present

## 2016-06-22 DIAGNOSIS — I4892 Unspecified atrial flutter: Secondary | ICD-10-CM

## 2016-06-22 DIAGNOSIS — D509 Iron deficiency anemia, unspecified: Secondary | ICD-10-CM

## 2016-06-22 DIAGNOSIS — D529 Folate deficiency anemia, unspecified: Secondary | ICD-10-CM | POA: Diagnosis present

## 2016-06-22 LAB — CBC
HCT: 32 % — ABNORMAL LOW (ref 36.0–46.0)
Hemoglobin: 11.5 g/dL — ABNORMAL LOW (ref 12.0–15.0)
MCH: 34.1 pg — AB (ref 26.0–34.0)
MCHC: 35.9 g/dL (ref 30.0–36.0)
MCV: 95 fL (ref 78.0–100.0)
Platelets: 200 10*3/uL (ref 150–400)
RBC: 3.37 MIL/uL — ABNORMAL LOW (ref 3.87–5.11)
RDW: 13.6 % (ref 11.5–15.5)
WBC: 10.9 10*3/uL — ABNORMAL HIGH (ref 4.0–10.5)

## 2016-06-22 LAB — ECHOCARDIOGRAM COMPLETE
FS: 35 % (ref 28–44)
Height: 61 in
IV/PV OW: 1.01
LA diam end sys: 34 mm
LA diam index: 2.14 cm/m2
LASIZE: 34 mm
LAVOL: 38.2 mL
LAVOLA4C: 34 mL
LAVOLIN: 24 mL/m2
LVOT area: 2.54 cm2
LVOT diameter: 18 mm
PW: 10 mm — AB (ref 0.6–1.1)
WEIGHTICAEL: 2045.87 [oz_av]

## 2016-06-22 LAB — GLUCOSE, CAPILLARY
GLUCOSE-CAPILLARY: 191 mg/dL — AB (ref 65–99)
GLUCOSE-CAPILLARY: 120 mg/dL — AB (ref 65–99)
GLUCOSE-CAPILLARY: 99 mg/dL (ref 65–99)
Glucose-Capillary: 124 mg/dL — ABNORMAL HIGH (ref 65–99)
Glucose-Capillary: 170 mg/dL — ABNORMAL HIGH (ref 65–99)

## 2016-06-22 LAB — BASIC METABOLIC PANEL
Anion gap: 6 (ref 5–15)
BUN: 13 mg/dL (ref 6–20)
CO2: 30 mmol/L (ref 22–32)
CREATININE: 0.78 mg/dL (ref 0.44–1.00)
Calcium: 8.4 mg/dL — ABNORMAL LOW (ref 8.9–10.3)
Chloride: 100 mmol/L — ABNORMAL LOW (ref 101–111)
GFR calc non Af Amer: >60 mL/min (ref >60)
Glucose, Bld: 150 mg/dL — ABNORMAL HIGH (ref 65–99)
Potassium: 4 mmol/L (ref 3.5–5.1)
SODIUM: 136 mmol/L (ref 135–145)

## 2016-06-22 LAB — MAGNESIUM: Magnesium: 1.9 mg/dL (ref 1.7–2.4)

## 2016-06-22 MED ORDER — FERUMOXYTOL INJECTION 510 MG/17 ML
510.0000 mg | Freq: Once | INTRAVENOUS | Status: AC
  Administered 2016-06-22: 510 mg via INTRAVENOUS
  Filled 2016-06-22: qty 17

## 2016-06-22 MED ORDER — APIXABAN 2.5 MG PO TABS: 2.5000 mg | ORAL_TABLET | Freq: Two times a day (BID) | ORAL | 0 refills | Status: DC

## 2016-06-22 MED ORDER — DILTIAZEM HCL 30 MG PO TABS
30.0000 mg | ORAL_TABLET | Freq: Four times a day (QID) | ORAL | Status: DC
  Administered 2016-06-22 – 2016-06-23 (×5): 30 mg via ORAL
  Filled 2016-06-22 (×5): qty 1

## 2016-06-22 MED ORDER — SENNOSIDES-DOCUSATE SODIUM 8.6-50 MG PO TABS
1.0000 | ORAL_TABLET | Freq: Two times a day (BID) | ORAL | Status: DC
  Administered 2016-06-22 – 2016-06-25 (×6): 1 via ORAL
  Filled 2016-06-22 (×7): qty 1

## 2016-06-22 MED ORDER — FOLIC ACID 1 MG PO TABS
1.0000 mg | ORAL_TABLET | Freq: Every day | ORAL | Status: DC
  Administered 2016-06-22 – 2016-06-25 (×4): 1 mg via ORAL
  Filled 2016-06-22 (×4): qty 1

## 2016-06-22 MED ORDER — TRAMADOL-ACETAMINOPHEN 37.5-325 MG PO TABS: 1.0000 | ORAL_TABLET | Freq: Four times a day (QID) | ORAL | 0 refills | Status: DC | PRN

## 2016-06-22 NOTE — Progress Notes (Signed)
   Subjective:  Patient reports pain as mild to moderate.  Delirium o/n.  Objective:   VITALS:   Vitals:   06/22/16 0400 06/22/16 0500 06/22/16 0600 06/22/16 0713  BP: 138/66 119/69 136/62   Pulse:      Resp: (!) 21 18 (!) 21   Temp:    97.6 F (36.4 C)  TempSrc:    Axillary  SpO2:      Weight:      Height:       NAD, confused ABD soft Sensation intact distally Intact pulses distally Dorsiflexion/Plantar flexion intact Incision: dressing C/D/I Compartment soft  Lab Results  Component Value Date   WBC 8.7 06/21/2016   HGB 9.7 (L) 06/21/2016   HCT 27.5 (L) 06/21/2016   MCV 96.8 06/21/2016   PLT 106 (L) 06/21/2016   BMET    Component Value Date/Time   NA 137 06/21/2016 0339   K 3.1 (L) 06/21/2016 0339   CL 114 (H) 06/21/2016 0339   CO2 18 (L) 06/21/2016 0339   GLUCOSE 127 (H) 06/21/2016 0339   BUN 15 06/21/2016 0339   CREATININE 0.92 06/21/2016 0339   CALCIUM 6.4 (LL) 06/21/2016 0339   GFRNONAA 51 (L) 06/21/2016 0339   GFRAA 59 (L) 06/21/2016 0339     Assessment/Plan: 2 Days Post-Op   Principal Problem:   Closed left hip fracture (HCC) Active Problems:   UTI (urinary tract infection)   Atrial flutter with rapid ventricular response (HCC)   Dementia   Hypothyroidism   Hyperglycemia   DM (diabetes mellitus), type 2 ([place])   Fall at home   RBBB   Elevated troponin   Closed subcapital fracture of left femur (HCC)   Left displaced femoral neck fracture (HCC)   Hypocalcemia   Hypokalemia   WBAT with walker DVT ppx: apixaban for 30 days - long term anticoag not recommended by cardiology due to risk of falls, SCDs, TEDs PT/OT D/C planning, SNF placement   Tali Coster, Horald Pollen 06/22/2016, 7:16 AM   Rod Can, MD Cell [phone]

## 2016-06-22 NOTE — Progress Notes (Signed)
  Echocardiogram 2D Echocardiogram has been performed.  Brittany Chambers 06/22/2016, 11:57 AM

## 2016-06-22 NOTE — Progress Notes (Signed)
PROGRESS NOTE    Brittany Chambers  [id number] DOB: [birth year] DOA: 06/18/2016 PCP: Leeroy Cha, MD    Brief Narrative:  Patient is a 82 year old lady history of dementia, recurrent cataracts, gastroesophageal reflux disease, hypothyroidism, failure to thrive who presented to the ED after a fall while using the bathroom. Patient noted to have a left hip fracture. Patient also noted to be significantly hypotensive and in a flutter with RVR. First set of troponins mildly elevated. Patient placed on a Cardizem drip in place in the step down unit. Cardiology consulted for preop clearance and management of A. fib and elevated troponins. Patient s/p hip repair 06/20/2016.   Assessment & Plan:   Principal Problem:   Closed left hip fracture (HCC) Active Problems:   Atrial flutter with rapid ventricular response (HCC)   UTI (urinary tract infection)   Dementia   Hypothyroidism   Hyperglycemia   DM (diabetes mellitus), type 2 (HCC)   Fall at home   RBBB   Elevated troponin   Closed subcapital fracture of left femur (HCC)   Left displaced femoral neck fracture (HCC)   Hypocalcemia   Hypokalemia   Iron deficiency anemia   Anemia due to vitamin B12 deficiency   Anemia due to folic acid deficiency  #1 closed left hip fracture Likely secondary to mechanical fall patient sustained while going to the bathroom. Patient has been seen in consultation by orthopedics and patient status post left hip hemiarthroplasty 06/20/2016 per Dr. Lyla Glassing. 2-D echo pending. Not done prior to surgery as it was felt that there would be difficulty obtaining good images and very difficult to turn patient without causing significant pain. Per cardiology feels patient has good LV function based on normal blood pressure, heart rate and good pulses and feel not necessary to obtain echo prior to surgery. Patient was seen by cardiology preoperatively. Per orthopedics, patient to be started eliquis for DVT  prophylaxis x 1 month.  Orthopedics following.  #2 atrial flutter with RVR CHA2DS2VASC = 4 Patient noted to be in a flutter with RVR on admission and placed on a Cardizem drip currently now with rate control. Cardiology recommended addition of low-dose beta blocker which was started last evening with Coreg 3.125 mg twice a day. Coreg was subsequently discontinued secondary to borderline blood pressure with systolic blood pressures in the 90s. Patient not on anticoagulation and patient high risk for falls and as such likely not a good long-term candidate for chronic anticoagulation. Patient been transitioned off Cardizem drip to oral Cardizem 30 mg every 6 hours per cardiology recommendations. Cardiology following and appreciate input and recommendations.  #3 dementia Stable.  #4 Escherichia coli urinary tract infection Patient afebrile. WBC trending down. Transitioned from IV Rocephin to oral Ceftin to complete course of antibiotic treatment. Once antibiotic treatment has been completed will resume patient's prophylactic antibiotics.   #5 diabetes mellitus type 2 Blood glucose in the 200s on admission. CBGs currently ranging from 124-208. Continue low-dose Lantus 5 units daily. Continue sliding scale insulin.  #6 elevated troponin May be secondary to demand ischemia secondary to fall versus NSTEMI. Cardiac enzymes mildly elevated to 0.11 which seems to have plateaued. Patient denied any chest pain. 2-D echo pending. Patient was started on Coreg however due to systolic blood pressures in the 80s and 90s Coreg was discontinued. Patient being transition of Cardizem drip to oral Cardizem per cardiology. Cardiology following and appreciate input and recommendations.  #7 hypothyroidism TSH at 5.188. Patient was noted to be taking Synthroid  daily except on 2 days during the week. Continue Synthroid on a daily basis and will need repeat thyroid function studies done in about 4-6 weeks.  #8 right bundle  branch block  #9 Fall Questionable etiology. Mechanical versus secondary to UTI versus secondary to elevated troponins. Urine cultures pending. Patient on empiric IV antibiotics. Cardiac enzymes elevated up to 0.11 which seems to have plateaued.  2-D echo pending. Cardiology following. PT/OT postoperatively or when okay with orthopedics.  #10 hypokalemia Magnesium level was 1.6. Labs pending for this morning. Patient has been given oral potassium supplementation as well as IV magnesium supplementation.   #11 iron deficiency anemia/acute blood loss anemia/folic acid [id number] deficiency Patient with no overt bleeding. Hemoglobin currently at 9.7 from 14.3 on admission. Hemoglobin back up to 11.5. Likely postop acute blood loss anemia. Anemia panel consistent with iron deficiency anemia, folate deficiency, B-12 deficiency. We'll place on vitamin B-12 1000 MCG's IM daily, folic acid 1 mg by mouth daily, IV iron for pharmacy. Transfusion threshold hemoglobin less than 7.  #12 hypocalcemia Questionable etiology. Vitamin D levels on the low end of normal concern for vitamin D deficiency. PTH, intact calcium pending. magnesium, phosphorus level. Continue calcium citrate 400 mg 4 times daily and vitamin D 1000 international units needs daily. Labs are pending for this morning.    DVT prophylaxis: SCDs Code Status: DO NOT RESUSCITATE Family Communication: Updated patient. No family at bedside. Disposition Plan: Remain the step down unit. Likely skilled nursing facility versus home with home health postoperatively and per orthopedics.   Consultants:   Orthopedics: Dr. Alvan Dame 06/18/2016  Cardiology: Dr. Acie Fredrickson 06/19/2016  Procedures:   Chest x-ray 06/18/2016  Plain films of the left hip 06/18/2016  Plain. The left tib-fib 06/18/2016  Left hip hemiarthroplasty 06/20/2016 per Dr.Swinteck  Antimicrobials:   IV Rocephin 06/18/2016>>>>>06/21/2016  Oral Ceftin  06/21/2016   Subjective: Patient laying pleasantly confused. Patient denies any shortness of breath. Patient denies any chest pain.   Objective: Vitals:   06/22/16 0713 06/22/16 0800 06/22/16 0814 06/22/16 0900  BP:  136/72 136/72 (!) 143/61  Pulse:    (!) 109  Resp:  15  (!) 31  Temp: 97.6 F (36.4 C)     TempSrc: Axillary     SpO2:  92%  93%  Weight:      Height:        Intake/Output Summary (Last 24 hours) at 06/22/16 1057 Last data filed at 06/22/16 1036  Gross per 24 hour  Intake             1740 ml  Output             1075 ml  Net              665 ml   Filed Weights   06/18/16 2200  Weight: 58 kg (127 lb 13.9 oz)    Examination:  General exam: In bed. Respiratory system: Clear to auscultation anterior lung fields.Marland Kitchen Respiratory effort normal. Cardiovascular system: Irregularly irregular. No JVD, murmurs, rubs, gallops or clicks. No pedal edema. Gastrointestinal system: Abdomen is nondistended, soft and nontender. No organomegaly or masses felt. Normal bowel sounds heard. Central nervous system: Alert. Disoriented. No focal neurological deficits. Extremities: No c/c/e Skin: No rashes, lesions or ulcers Psychiatry: Judgement and insight poor-fair. Mood & affect appropriate.     Data Reviewed: I have personally reviewed following labs and imaging studies  CBC:  Recent Labs Lab 06/18/16 1434 06/19/16 0157 06/20/16 0405 06/21/16 0339 06/22/16  0824  WBC 7.4 14.6* 13.2* 8.7 10.9*  NEUTROABS 5.3  --  11.4*  --   --   HGB 15.8* 15.0 14.3 9.7* 11.5*  HCT 45.5 41.0 40.2 27.5* 32.0*  MCV 95.0 92.8 95.7 96.8 95.0  PLT 172 158 174 106* [id number]   Basic Metabolic Panel:  Recent Labs Lab 06/18/16 1434 06/19/16 0157 06/20/16 0405 06/21/16 0339 06/21/16 0857 06/22/16 0824  NA 133* 131* 134* 137  --  136  K 4.0 4.4 4.4 3.1*  --  4.0  CL 97* 101 104 114*  --  100*  CO2 23 21* 20* 18*  --  30  GLUCOSE 219* 236* 191* 127*  --  150*  BUN 14 16 16 15   --  13   CREATININE 1.57* 1.46* 1.19* 0.92  --  0.78  CALCIUM 9.7 8.9 9.3 6.4*  --  8.4*  MG  --   --   --   --  1.6* 1.9  PHOS  --   --   --   --  2.5  --    GFR: Estimated Creatinine Clearance: 34.5 mL/min (by C-G formula based on SCr of 0.78 mg/dL). Liver Function Tests:  Recent Labs Lab 06/19/16 0157 06/21/16 0857  AST  --  24  ALT  --  12*  ALKPHOS  --  46  BILITOT  --  0.4  PROT  --  6.0*  ALBUMIN 3.9 2.9*   No results for input(s): LIPASE, AMYLASE in the last 168 hours. No results for input(s): AMMONIA in the last 168 hours. Coagulation Profile:  Recent Labs Lab 06/18/16 1434 06/19/16 0157  INR 0.98 1.03   Cardiac Enzymes:  Recent Labs Lab 06/18/16 2057 06/19/16 0157 06/19/16 0806 06/19/16 1345 06/19/16 2008  TROPONINI 0.03* 0.09* 0.11* 0.11* 0.10*   BNP (last 3 results) No results for input(s): PROBNP in the last 8760 hours. HbA1C: No results for input(s): HGBA1C in the last 72 hours. CBG:  Recent Labs Lab 06/21/16 1647 06/21/16 1934 06/21/16 2333 06/22/16 0325 06/22/16 0730  GLUCAP 244* 178* 208* 191* 124*   Lipid Profile: No results for input(s): CHOL, HDL, LDLCALC, TRIG, CHOLHDL, LDLDIRECT in the last 72 hours. Thyroid Function Tests: No results for input(s): TSH, T4TOTAL, FREET4, T3FREE, THYROIDAB in the last 72 hours. Anemia Panel:  Recent Labs  06/21/16 0857  VITAMINB12 122*  FOLATE 2.9*  FERRITIN 191  TIBC 248*  IRON 24*  RETICCTPCT 2.3   Sepsis Labs:  Recent Labs Lab 06/18/16 1658  LATICACIDVEN 1.75    Recent Results (from the past 240 hour(s))  Urine culture     Status: Abnormal   Collection Time: 06/18/16  6:10 PM  Result Value Ref Range Status   Specimen Description URINE, RANDOM  Final   Special Requests NONE  Final   Culture >=100,000 COLONIES/mL ESCHERICHIA COLI (A)  Final   Report Status 06/21/2016 FINAL  Final   Organism ID, Bacteria ESCHERICHIA COLI (A)  Final      Susceptibility   Escherichia coli - MIC*     AMPICILLIN >=32 RESISTANT Resistant     CEFAZOLIN <=4 SENSITIVE Sensitive     CEFTRIAXONE <=1 SENSITIVE Sensitive     CIPROFLOXACIN <=0.25 SENSITIVE Sensitive     GENTAMICIN <=1 SENSITIVE Sensitive     IMIPENEM <=0.25 SENSITIVE Sensitive     NITROFURANTOIN 64 INTERMEDIATE Intermediate     TRIMETH/SULFA >=320 RESISTANT Resistant     AMPICILLIN/SULBACTAM 16 INTERMEDIATE Intermediate     PIP/TAZO <=4 SENSITIVE  Sensitive     Extended ESBL NEGATIVE Sensitive     * >=100,000 COLONIES/mL ESCHERICHIA COLI  MRSA PCR Screening     Status: None   Collection Time: 06/18/16  8:48 PM  Result Value Ref Range Status   MRSA by PCR NEGATIVE NEGATIVE Final    Comment:        The GeneXpert MRSA Assay (FDA approved for NASAL specimens only), is one component of a comprehensive MRSA colonization surveillance program. It is not intended to diagnose MRSA infection nor to guide or monitor treatment for MRSA infections.          Radiology Studies: Pelvis Portable  Result Date: 06/20/2016 CLINICAL DATA:  Postoperative hemi left hip replacement. EXAM: PORTABLE PELVIS 1-2 VIEWS COMPARISON:  Abdominal CT 07/24/2015 FINDINGS: Status post left hip hemiarthroplasty with normal alignment of the prosthetic left femoral head with the acetabulum. Expected postsurgical changes. No evidence of fracture. IMPRESSION: Status post left hip hemiarthroplasty without evidence of immediate complications. Electronically Signed   By: Fidela Salisbury M.D.   On: 06/20/2016 11:20   Dg Chest Port 1 View  Result Date: 06/21/2016 CLINICAL DATA:  PICC line placement EXAM: PORTABLE CHEST 1 VIEW COMPARISON:  06/18/2016 FINDINGS: At sided PICC line tip: Lower SVC. No pneumothorax or complicating feature. There is obscuration of the right hemidiaphragm possibly from right lower lobe atelectasis. Minimal obscuration of the left hemidiaphragm medially. Atherosclerotic calcification of the aorta. Vertebral augmentation near the  thoracolumbar junction. IMPRESSION: 1. Right PICC line tip:  Lower SVC.  No pneumothorax. 2. Indistinct airspace opacities at both lung bases, right greater than left, favoring atelectasis. 3. Atherosclerosis. Electronically Signed   By: Van Clines M.D.   On: 06/21/2016 17:01        Scheduled Meds: . ALPRAZolam  1 mg Oral QHS  . apixaban  2.5 mg Oral BID  . calcium citrate  400 mg of elemental calcium Oral QID  . cefUROXime  250 mg Oral BID WC  . cholecalciferol  1,000 Units Oral Daily  . cyanocobalamin  1,000 mcg Intramuscular Daily  . diltiazem  30 mg Oral Q6H  . famotidine  10 mg Oral Daily  . folic acid  1 mg Oral Daily  . insulin aspart  0-9 Units Subcutaneous Q4H  . insulin glargine  5 Units Subcutaneous Daily  . levothyroxine  100 mcg Oral Once per day on Sun Tue Thu Fri Sat  . loratadine  10 mg Oral Daily  . sodium chloride flush  10-40 mL Intracatheter Q12H  . traMADol-acetaminophen  1 tablet Oral BID   Continuous Infusions: .  sodium bicarbonate 150 mEq in sterile water 1000 mL infusion 75 mL/hr at 06/22/16 0900     LOS: 4 days    Time spent: 32 mins    Nishika Parkhurst, MD [facility] Pager [phone]  If 7PM-7AM, please contact night-coverage [email or web address] Password [facility] 06/22/2016, 10:57 AM

## 2016-06-22 NOTE — Progress Notes (Signed)
Patient Name: Brittany Chambers Date of Encounter: 06/22/2016  Primary Cardiologist: Dr. Mertie Moores  Subjective   Resting this morning. No major events overnight per nursing.  Inpatient Medications    Scheduled Meds: . ALPRAZolam  1 mg Oral QHS  . apixaban  2.5 mg Oral BID  . calcium citrate  400 mg of elemental calcium Oral QID  . cefUROXime  250 mg Oral BID WC  . cholecalciferol  1,000 Units Oral Daily  . cyanocobalamin  1,000 mcg Intramuscular Daily  . famotidine  10 mg Oral Daily  . insulin aspart  0-9 Units Subcutaneous Q4H  . insulin glargine  5 Units Subcutaneous Daily  . levothyroxine  100 mcg Oral Once per day on Sun Tue Thu Fri Sat  . loratadine  10 mg Oral Daily  . sodium chloride flush  10-40 mL Intracatheter Q12H  . traMADol-acetaminophen  1 tablet Oral BID   Continuous Infusions: . diltiazem (CARDIZEM) infusion 5 mg/hr (06/21/16 1800)  .  sodium bicarbonate 150 mEq in sterile water 1000 mL infusion 75 mL/hr at 06/21/16 1600   PRN Meds: acetaminophen **OR** acetaminophen, bisacodyl, HYDROcodone-acetaminophen, menthol-cetylpyridinium **OR** phenol, methocarbamol **OR** methocarbamol (ROBAXIN)  IV, metoCLOPramide **OR** metoCLOPramide (REGLAN) injection, morphine injection, ondansetron **OR** ondansetron (ZOFRAN) IV, polyethylene glycol, sodium chloride flush   Vital Signs    Vitals:   06/21/16 2000 06/21/16 2100 06/21/16 2335 06/22/16 0326  BP: (!) 149/67 (!) 156/66    Pulse: 97 96    Resp: 18 18    Temp:   97.9 F (36.6 C) 97.9 F (36.6 C)  TempSrc:   Oral Oral  SpO2: 93% 95%    Weight:      Height:        Intake/Output Summary (Last 24 hours) at 06/22/16 0554 Last data filed at 06/21/16 1600  Gross per 24 hour  Intake            527.5 ml  Output              300 ml  Net            227.5 ml   Filed Weights   06/18/16 2200  Weight: 127 lb 13.9 oz (58 kg)    Physical Exam   Gen.: Frail appearing elderly woman. HEENT: Contusion by  Steri-Strips left orbital area.. Neck: Supple, no elevated JVP or carotid bruits. Lungs: Clear to auscultation, nonlabored breathing at rest. Cardiac: Irregularly irregular, no S3, soft systolic murmur. Abdomen: Soft, nontender, bowel sounds present. Extremities: No pitting edema, distal pulses 2+.  Labs    CBC  Recent Labs  06/20/16 0405 06/21/16 0339  WBC 13.2* 8.7  NEUTROABS 11.4*  --   HGB 14.3 9.7*  HCT 40.2 27.5*  MCV 95.7 96.8  PLT 174 [id number]*   Basic Metabolic Panel  Recent Labs  06/20/16 0405 06/21/16 0339 06/21/16 0857  NA 134* 137  --   K 4.4 3.1*  --   CL 104 114*  --   CO2 20* 18*  --   GLUCOSE 191* 127*  --   BUN 16 15  --   CREATININE 1.19* 0.92  --   CALCIUM 9.3 6.4*  --   MG  --   --  1.6*  PHOS  --   --  2.5   Liver Function Tests  Recent Labs  06/21/16 0857  AST 24  ALT 12*  ALKPHOS 46  BILITOT 0.4  PROT 6.0*  ALBUMIN 2.9*   Cardiac Enzymes  Recent Labs  06/19/16 0806 06/19/16 1345 06/19/16 2008  TROPONINI 0.11* 0.11* 0.10*    Telemetry    I personally reviewed telemetry monitoring which shows atrial flutter with variable conduction, heart rate around 100 bpm.  Radiology    Pelvis Portable  Result Date: 06/20/2016 CLINICAL DATA:  Postoperative hemi left hip replacement. EXAM: PORTABLE PELVIS 1-2 VIEWS COMPARISON:  Abdominal CT 07/24/2015 FINDINGS: Status post left hip hemiarthroplasty with normal alignment of the prosthetic left femoral head with the acetabulum. Expected postsurgical changes. No evidence of fracture. IMPRESSION: Status post left hip hemiarthroplasty without evidence of immediate complications. Electronically Signed   By: Fidela Salisbury M.D.   On: 06/20/2016 11:20   Dg Chest Port 1 View  Result Date: 06/21/2016 CLINICAL DATA:  PICC line placement EXAM: PORTABLE CHEST 1 VIEW COMPARISON:  06/18/2016 FINDINGS: At sided PICC line tip: Lower SVC. No pneumothorax or complicating feature. There is obscuration of  the right hemidiaphragm possibly from right lower lobe atelectasis. Minimal obscuration of the left hemidiaphragm medially. Atherosclerotic calcification of the aorta. Vertebral augmentation near the thoracolumbar junction. IMPRESSION: 1. Right PICC line tip:  Lower SVC.  No pneumothorax. 2. Indistinct airspace opacities at both lung bases, right greater than left, favoring atelectasis. 3. Atherosclerosis. Electronically Signed   By: Van Clines M.D.   On: 06/21/2016 17:01   Dg C-arm 1-60 Min-no Report  Result Date: 06/20/2016 There is no Radiologist interpretation  for this exam.  Dg Hip Operative Unilat W Or W/o Pelvis Left  Result Date: 06/20/2016 CLINICAL DATA:  Left hip arthroplasty post femoral neck fracture. EXAM: OPERATIVE left HIP (WITH PELVIS IF PERFORMED) 3 VIEWS TECHNIQUE: Fluoroscopic spot image(s) were submitted for interpretation post-operatively. COMPARISON:  06/18/2016 and 06/20/2016 FINDINGS: There has been placement of a left hip arthroplasty intact and normally located. Recommend correlation with findings at the time of the procedure. IMPRESSION: Left hip arthroplasty intact and normally located. Electronically Signed   By: Marin Olp M.D.   On: 06/20/2016 11:34    Cardiac Studies   Echocardiogram pending.  Patient Profile     82 year old woman with history of dementia, diabetes mellitus, GERD, hypothyroidism, presenting status post fall with left hip fracture.She underwent left hip hemiarthroplasty on December 23. Seen for preoperative cardiology consultation by Dr. Acie Fredrickson.  Assessment & Plan    1. Atypical atrial flutter with variable conduction, duration uncertain. Heart rate is better controlled on intravenous diltiazem. CHADSVASC score is 4, felt not to be optimal long-term anticoagulation candidate at this point (see consult recommendations).  2. Postoperative day 3 status post left hip hemiarthroplasty.Started on Eliquis for limited course.  Try and  transition from intravenous diltiazem to diltiazem 30 mg by mouth every 6 hours and then simplify to long-acting regimen depending on heart rate control and need for further titration.  Signed, Satira Sark, M.D., F.A.C.C.  06/22/2016, 5:54 AM

## 2016-06-22 NOTE — Progress Notes (Signed)
Brief Pharmacy note: IV iron requested  Iron, TIBC and saturatin ratio slightly below low end of normal Folate & B12 levels low  Plan: Feraheme 510mg  IVPB x1 today, may repeat in 3-8 days if necessary. Suggest po Iron supplement along with [id number] & Folic acid repletion  Minda Ditto PharmD Pager [phone] 06/22/2016, 8:23 AM

## 2016-06-23 ENCOUNTER — Encounter (HOSPITAL_COMMUNITY): Payer: Self-pay | Admitting: Internal Medicine

## 2016-06-23 DIAGNOSIS — I4891 Unspecified atrial fibrillation: Secondary | ICD-10-CM

## 2016-06-23 DIAGNOSIS — I350 Nonrheumatic aortic (valve) stenosis: Secondary | ICD-10-CM

## 2016-06-23 DIAGNOSIS — S72002D Fracture of unspecified part of neck of left femur, subsequent encounter for closed fracture with routine healing: Secondary | ICD-10-CM

## 2016-06-23 HISTORY — DX: Nonrheumatic aortic (valve) stenosis: I35.0

## 2016-06-23 LAB — CBC
HCT: 33.5 % — ABNORMAL LOW (ref 36.0–46.0)
Hemoglobin: 11.9 g/dL — ABNORMAL LOW (ref 12.0–15.0)
MCH: 34.5 pg — ABNORMAL HIGH (ref 26.0–34.0)
MCHC: 35.5 g/dL (ref 30.0–36.0)
MCV: 97.1 fL (ref 78.0–100.0)
PLATELETS: 190 10*3/uL (ref 150–400)
RBC: 3.45 MIL/uL — AB (ref 3.87–5.11)
RDW: 13.9 % (ref 11.5–15.5)
WBC: 11.6 10*3/uL — AB (ref 4.0–10.5)

## 2016-06-23 LAB — BASIC METABOLIC PANEL
ANION GAP: 9 (ref 5–15)
BUN: 11 mg/dL (ref 6–20)
CO2: 31 mmol/L (ref 22–32)
Calcium: 8.8 mg/dL — ABNORMAL LOW (ref 8.9–10.3)
Chloride: 97 mmol/L — ABNORMAL LOW (ref 101–111)
Creatinine, Ser: 0.9 mg/dL (ref 0.44–1.00)
GFR calc Af Amer: >60 mL/min (ref >60)
GFR, EST NON AFRICAN AMERICAN: 53 mL/min — AB (ref >60)
Glucose, Bld: 109 mg/dL — ABNORMAL HIGH (ref 65–99)
Potassium: 4.1 mmol/L (ref 3.5–5.1)
SODIUM: 137 mmol/L (ref 135–145)

## 2016-06-23 LAB — GLUCOSE, CAPILLARY
GLUCOSE-CAPILLARY: 107 mg/dL — AB (ref 65–99)
GLUCOSE-CAPILLARY: 99 mg/dL (ref 65–99)
GLUCOSE-CAPILLARY: 136 mg/dL — AB (ref 65–99)
Glucose-Capillary: 105 mg/dL — ABNORMAL HIGH (ref 65–99)
Glucose-Capillary: 111 mg/dL — ABNORMAL HIGH (ref 65–99)
Glucose-Capillary: 133 mg/dL — ABNORMAL HIGH (ref 65–99)

## 2016-06-23 LAB — PTH, INTACT AND CALCIUM
Calcium, Total (PTH): 8.7 mg/dL (ref 8.7–10.3)
PTH: 36 pg/mL (ref 15–65)

## 2016-06-23 LAB — MAGNESIUM: MAGNESIUM: 1.8 mg/dL (ref 1.7–2.4)

## 2016-06-23 MED ORDER — DILTIAZEM HCL ER COATED BEADS 120 MG PO CP24
120.0000 mg | ORAL_CAPSULE | Freq: Every day | ORAL | Status: DC
  Administered 2016-06-23: 120 mg via ORAL
  Filled 2016-06-23: qty 1

## 2016-06-23 NOTE — Progress Notes (Signed)
Patient Name: Brittany Chambers Doctor Date of Encounter: 06/23/2016  Primary Cardiologist: Dr. Mertie Moores  Subjective   Resting this morning. No major events overnight per nursing. Sleeping  Spoke with daughter   Inpatient Medications    Scheduled Meds: . ALPRAZolam  1 mg Oral QHS  . apixaban  2.5 mg Oral BID  . calcium citrate  400 mg of elemental calcium Oral QID  . cefUROXime  250 mg Oral BID WC  . cholecalciferol  1,000 Units Oral Daily  . cyanocobalamin  1,000 mcg Intramuscular Daily  . diltiazem  120 mg Oral Daily  . famotidine  10 mg Oral Daily  . folic acid  1 mg Oral Daily  . insulin aspart  0-9 Units Subcutaneous Q4H  . insulin glargine  5 Units Subcutaneous Daily  . levothyroxine  100 mcg Oral Once per day on Sun Tue Thu Fri Sat  . loratadine  10 mg Oral Daily  . senna-docusate  1 tablet Oral BID  . sodium chloride flush  10-40 mL Intracatheter Q12H  . traMADol-acetaminophen  1 tablet Oral BID   Continuous Infusions:  PRN Meds: acetaminophen **OR** acetaminophen, bisacodyl, HYDROcodone-acetaminophen, menthol-cetylpyridinium **OR** phenol, methocarbamol **OR** methocarbamol (ROBAXIN)  IV, metoCLOPramide **OR** metoCLOPramide (REGLAN) injection, morphine injection, ondansetron **OR** ondansetron (ZOFRAN) IV, polyethylene glycol, sodium chloride flush   Vital Signs    Vitals:   06/23/16 0500 06/23/16 0600 06/23/16 0700 06/23/16 1200  BP: (!) 122/54 (!) 97/51 (!) 147/81   Pulse: 94 94 92   Resp: 18 18 18    Temp:   98 F (36.7 C) 97.9 F (36.6 C)  TempSrc:      SpO2: 100% 100% 100%   Weight:      Height:        Intake/Output Summary (Last 24 hours) at 06/23/16 1223 Last data filed at 06/23/16 0600  Gross per 24 hour  Intake             1470 ml  Output             2825 ml  Net            -1355 ml   Filed Weights   06/18/16 2200  Weight: 127 lb 13.9 oz (58 kg)    Physical Exam   Gen.: Frail appearing elderly woman. HEENT: Contusion by  Steri-Strips left orbital area.. Neck: Supple, no elevated JVP or carotid bruits. Lungs: Clear to auscultation, nonlabored breathing at rest. Cardiac: Irregularly irregular, no S3, soft systolic murmur. Abdomen: Soft, nontender, bowel sounds present. Extremities: No pitting edema, distal pulses 2+.  Labs    CBC  Recent Labs  06/22/16 0824 06/23/16 0313  WBC 10.9* 11.6*  HGB 11.5* 11.9*  HCT 32.0* 33.5*  MCV 95.0 97.1  PLT 200 [id number]   Basic Metabolic Panel  Recent Labs  06/21/16 0857 06/22/16 0824 06/23/16 0313  NA  --  136 137  K  --  4.0 4.1  CL  --  100* 97*  CO2  --  30 31  GLUCOSE  --  150* 109*  BUN  --  13 11  CREATININE  --  0.78 0.90  CALCIUM  --  8.4* 8.8*  MG 1.6* 1.9 1.8  PHOS 2.5  --   --    Liver Function Tests  Recent Labs  06/21/16 0857  AST 24  ALT 12*  ALKPHOS 46  BILITOT 0.4  PROT 6.0*  ALBUMIN 2.9*   Cardiac Enzymes No results for input(s): CKTOTAL,  CKMB, CKMBINDEX, TROPONINI in the last 72 hours.  Telemetry    I personally reviewed telemetry monitoring which shows atrial flutter with variable conduction, heart rate around 100 bpm.  Radiology    Dg Chest Port 1 View  Result Date: 06/21/2016 CLINICAL DATA:  PICC line placement EXAM: PORTABLE CHEST 1 VIEW COMPARISON:  06/18/2016 FINDINGS: At sided PICC line tip: Lower SVC. No pneumothorax or complicating feature. There is obscuration of the right hemidiaphragm possibly from right lower lobe atelectasis. Minimal obscuration of the left hemidiaphragm medially. Atherosclerotic calcification of the aorta. Vertebral augmentation near the thoracolumbar junction. IMPRESSION: 1. Right PICC line tip:  Lower SVC.  No pneumothorax. 2. Indistinct airspace opacities at both lung bases, right greater than left, favoring atelectasis. 3. Atherosclerosis. Electronically Signed   By: Van Clines M.D.   On: 06/21/2016 17:01    Cardiac Studies   Echocardiogram pending.  Patient Profile       82 year old woman with history of dementia, diabetes mellitus, GERD, hypothyroidism, presenting status post fall with left hip fracture.She underwent left hip hemiarthroplasty on December 23. Seen for preoperative cardiology consultation by Dr. Acie Fredrickson.  Assessment & Plan    1. Atypical atrial flutter with variable conduction, duration uncertain. Heart rate is better controlled on intravenous diltiazem. CHADSVASC score is 4, felt not to be optimal long-term anticoagulation candidate at this point (see consult recommendations).   2. Postoperative day 3 status post left hip hemiarthroplasty.Started on Eliquis for limited course. Ortho has placed her on low dose Eliquis for DVT prophylaxis following hip surgery .   I agree with this.  She is not a good candidate for long term anticoagulation but this issue can be discussed again in the future.     Is now on Cardizem CD  No further cardiology recs. Will sign off. Call for questions    Mertie Moores, MD  06/23/2016 12:26 PM    [address],  [place], [place]  [address] Pager [phone] Phone: [phone]; Fax: [phone]

## 2016-06-23 NOTE — Progress Notes (Signed)
OT Cancellation Note  Patient Details Name: PHILIS GELO MRN: [id number] DOB: 1918/12/28   Cancelled Treatment:    Reason Eval/Treat Not Completed: Fatigue/lethargy limiting ability to participate  Noted pt very lethargic with PT this day. OT will check on pt next day. Kari Baars, [place] Payton Mccallum D 06/23/2016, 3:41 PM

## 2016-06-23 NOTE — Progress Notes (Signed)
Physical Therapy Treatment Patient Details Name: NYREE WEICHMAN MRN: [id number] DOB: March 10, 1919 Today's Date: 06/23/2016    History of Present Illness Patient is a 82 year old lady history of dementia,  hypothyroidism, failure to thrive who presented to the ED after a fall while using the bathroom. Patient noted to have a left hip fracture. Patient also noted to be significantly hypotensive and in a flutter with RVR. First set of troponins mildly elevated. Patient placed on a Cardizem drip in place in the step down unit. Cardiology consulted for preop clearance and management of Afib and elevated troponins. Patient s/p DA L hip hemi 06/20/2016.    PT Comments    The patient was very lethargic today and unable to transfer to recliner, only sat on the bed edge. Continue PT while in acute care.   Follow Up Recommendations  SNF;Supervision/Assistance - 24 hour     Equipment Recommendations  None recommended by PT    Recommendations for Other Services       Precautions / Restrictions Precautions Precautions: Fall Restrictions LLE Weight Bearing: Weight bearing as tolerated    Mobility  Bed Mobility Overal bed mobility: Needs Assistance Bed Mobility: Rolling Rolling: Total assist;+2 for physical assistance;+2 for safety/equipment   Supine to sit: Total assist;+2 for physical assistance;+2 for safety/equipment;HOB elevated Sit to supine: Total assist;+2 for physical assistance;+2 for safety/equipment   General bed mobility comments: The patient offered no assistance and barely aroused  during mobility. Assisted back to bed with total assistance  Transfers Overall transfer level: Needs assistance Equipment used: Rolling walker (2 wheeled)             General transfer comment: attempted x 2 but patient not aroused enough to participate.  Ambulation/Gait                 Stairs            Wheelchair Mobility    Modified Rankin (Stroke Patients Only)        Balance Overall balance assessment: History of Falls;Needs assistance Sitting-balance support: Feet supported;Bilateral upper extremity supported Sitting balance-Leahy Scale: Poor     Standing balance support: Bilateral upper extremity supported;During functional activity Standing balance-Leahy Scale: Zero                      Cognition Arousal/Alertness: Lethargic   Overall Cognitive Status: Impaired/Different from baseline Area of Impairment: Orientation;Attention;Following commands;Awareness       Following Commands: Follows one step commands inconsistently       General Comments: patient is lethargic and barely responding to verbal and tactile stim.    Exercises      General Comments        Pertinent Vitals/Pain Faces Pain Scale: Hurts whole lot Pain Location: l hip Pain Descriptors / Indicators: Grimacing;Guarding;Moaning Pain Intervention(s): Repositioned;Ice applied;Premedicated before session    Home Living                      Prior Function            PT Goals (current goals can now be found in the care plan section) Progress towards PT goals: Progressing toward goals    Frequency    Min 3X/week      PT Plan Current plan remains appropriate    Co-evaluation             End of Session Equipment Utilized During Treatment: Gait belt Activity Tolerance: Patient limited by lethargy Patient left:  in bed;with call bell/phone within reach;with bed alarm set;with family/visitor present     Time: 0923-1002 PT Time Calculation (min) (ACUTE ONLY): 39 min  Charges:  $Therapeutic Activity: 23-37 mins $Self Care/Home Management: 10-Mar-2023                    G Codes:      Menna, [place] 06/23/2016, 1:58 PM Tresa Endo PT [phone]

## 2016-06-23 NOTE — Progress Notes (Signed)
PROGRESS NOTE    Brittany Chambers  [id number] DOB: 21-Oct-1918 DOA: 06/18/2016 PCP: Leeroy Cha, MD    Brief Narrative:  Patient is a 82 year old lady history of dementia, recurrent cataracts, gastroesophageal reflux disease, hypothyroidism, failure to thrive who presented to the ED after a fall while using the bathroom. Patient noted to have a left hip fracture. Patient also noted to be significantly hypotensive and in a flutter with RVR. First set of troponins mildly elevated. Patient placed on a Cardizem drip in place in the step down unit. Cardiology consulted for preop clearance and management of A. fib and elevated troponins. Patient s/p hip repair 06/20/2016.   Assessment & Plan:   Principal Problem:   Closed left hip fracture (HCC) Active Problems:   Atrial flutter with rapid ventricular response (HCC)   UTI (urinary tract infection)   Dementia   Hypothyroidism   Hyperglycemia   DM (diabetes mellitus), type 2 ([place])   Fall at home   RBBB   Elevated troponin   Closed subcapital fracture of left femur (HCC)   Left displaced femoral neck fracture (HCC)   Hypocalcemia   Hypokalemia   Iron deficiency anemia   Anemia due to vitamin B12 deficiency   Anemia due to folic acid deficiency   Aortic stenosis, moderate: Per 2 D echo 06/22/2016  #1 closed left hip fracture Likely secondary to mechanical fall patient sustained while going to the bathroom. Patient has been seen in consultation by orthopedics and patient status post left hip hemiarthroplasty 06/20/2016 per Dr. Lyla Glassing. 2-D echo pending. Not done prior to surgery as it was felt that there would be difficulty obtaining good images and very difficult to turn patient without causing significant pain. Per cardiology feels patient has good LV function based on normal blood pressure, heart rate and good pulses and feel not necessary to obtain echo prior to surgery. Patient was seen by cardiology preoperatively. Per  orthopedics, started eliquis for DVT prophylaxis x 1 month.  Orthopedics following.  #2 atrial flutter with RVR CHA2DS2VASC = 4 Patient noted to be in a flutter with RVR on admission and placed on a Cardizem drip currently now with rate control. Cardiology recommended addition of low-dose beta blocker which was started last evening with Coreg 3.125 mg twice a day. Coreg was subsequently discontinued secondary to borderline blood pressure with systolic blood pressures in the 90s. Patient not on anticoagulation and patient high risk for falls and as such likely not a good long-term candidate for chronic anticoagulation. Patient been transitioned off Cardizem drip to oral Cardizem 30 mg every 6 hours per cardiology recommendations. Cardizem has been changed to Cardizem CD 120 mg daily and uptitrate as needed per cardiology. Cardiology following and appreciate input and recommendations.  #3 dementia Stable.  #4 Escherichia coli urinary tract infection Patient afebrile. WBC trending down. Transitioned from IV Rocephin to oral Ceftin to complete course of antibiotic treatment. Once antibiotic treatment has been completed will resume patient's prophylactic antibiotics.   #5 diabetes mellitus type 2 Blood glucose in the 200s on admission. CBGs currently ranging from 105-111. Continue low-dose Lantus 5 units daily. Continue sliding scale insulin.  #6 elevated troponin May be secondary to demand ischemia secondary to fall versus NSTEMI. Cardiac enzymes mildly elevated to 0.11 which seems to have plateaued. Patient denied any chest pain. 2-D echo with EF of 60-65%, no wall motion abnormalities, moderate aortic stenosis.  Patient was started on Coreg, however due to systolic blood pressures in the 80s and 90s  Coreg was discontinued. Patient transitioned of Cardizem drip to oral Cardizem per cardiology. Cardizem has been changed to CD 120 mg by cardiology and uptitrate as needed. Cardiology following and  appreciate input and recommendations.  #7 moderate aortic stenosis Per 2-D echo. Cardiology has discussed with family who at this time is not interested in any invasive measures/procedures. No further evaluation needed per cardiology.  #8 hypothyroidism TSH at 5.188. Patient was noted to be taking Synthroid daily except on 2 days during the week. Continue Synthroid on a daily basis and will need repeat thyroid function studies done in about 4-6 weeks.  #9 right bundle branch block  #10 Fall Questionable etiology. Mechanical versus secondary to UTI versus secondary to elevated troponins. Urine cultures pending. Patient on empiric IV antibiotics. Cardiac enzymes elevated up to 0.11 which seems to have plateaued.  2-D echo 60-65%, no wall motion abnormality, moderate aortic stenosis. Cardiology following. PT/OT.  #11 hypokalemia Magnesium level was 1.6. Repleted.   #12 iron deficiency anemia/acute blood loss anemia/folic acid [id number] deficiency Patient with no overt bleeding. Hemoglobin currently at 11.9 from 11.5 from 9.7 from 14.3 on admission. Likely postop acute blood loss anemia. Anemia panel consistent with iron deficiency anemia, folate deficiency, B-12 deficiency. Continue vitamin B-12 1000 MCG's IM daily, folic acid 1 mg by mouth daily, s/p IV iron for pharmacy. Transfusion threshold hemoglobin less than 7.  #13 hypocalcemia Questionable etiology. Likely secondary to vitamin D deficiency. Vitamin D levels on the low end of normal concern for vitamin D deficiency. PTH, intact calcium pending. Magnesium level was low at 1.6 and phosphorus on borderline low at 2.5. Patient given IV calcium gluconate 1. Hypocalcemia improving on supplementation. Continue calcium citrate 400 mg 4 times daily and vitamin D 1000 international units needs daily.    DVT prophylaxis: SCDs Code Status: DO NOT RESUSCITATE Family Communication: Updated patient and daughter at bedside. Disposition Plan:  Transient to telemetry. Likely skilled nursing facility versus home with home health postoperatively and per orthopedics.   Consultants:   Orthopedics: Dr. Alvan Dame 06/18/2016  Cardiology: Dr. Acie Fredrickson 06/19/2016  Procedures:   Chest x-ray 06/18/2016  Plain films of the left hip 06/18/2016  Plain. The left tib-fib 06/18/2016  Left hip hemiarthroplasty 06/20/2016 per Dr.Swinteck  Antimicrobials:   IV Rocephin 06/18/2016>>>>>06/21/2016  Oral Ceftin 06/21/2016>>>>>>06/25/2016   Subjective: Patient sleeping. Refusing to open eyes. Patient denies any shortness of breath. Patient denies any chest pain.   Objective: Vitals:   06/23/16 0400 06/23/16 0500 06/23/16 0600 06/23/16 0700  BP: (!) 111/49 (!) 122/54 (!) 97/51 (!) 147/81  Pulse: 97 94 94 92  Resp: 19 18 18 18   Temp:    98 F (36.7 C)  TempSrc:      SpO2: 100% 100% 100% 100%  Weight:      Height:        Intake/Output Summary (Last 24 hours) at 06/23/16 0957 Last data filed at 06/23/16 0600  Gross per 24 hour  Intake             1765 ml  Output             3100 ml  Net            -1335 ml   Filed Weights   06/18/16 2200  Weight: 58 kg (127 lb 13.9 oz)    Examination:  General exam: In bed.Sleeping. Respiratory system: Clear to auscultation anterior lung fields.Marland Kitchen Respiratory effort normal. Cardiovascular system: Irregularly irregular. No JVD, murmurs, rubs, gallops or  clicks. No pedal edema. Gastrointestinal system: Abdomen is nondistended, soft and nontender. No organomegaly or masses felt. Normal bowel sounds heard. Central nervous system: Sleeping. No focal neurological deficits. Extremities: No c/c/e Skin: No rashes, lesions or ulcers Psychiatry: Judgement and insight poor-fair. Mood & affect appropriate.     Data Reviewed: I have personally reviewed following labs and imaging studies  CBC:  Recent Labs Lab 06/18/16 1434 06/19/16 0157 06/20/16 0405 06/21/16 0339 06/22/16 0824 06/23/16 0313    WBC 7.4 14.6* 13.2* 8.7 10.9* 11.6*  NEUTROABS 5.3  --  11.4*  --   --   --   HGB 15.8* 15.0 14.3 9.7* 11.5* 11.9*  HCT 45.5 41.0 40.2 27.5* 32.0* 33.5*  MCV 95.0 92.8 95.7 96.8 95.0 97.1  PLT 172 158 174 106* 200 [id number]   Basic Metabolic Panel:  Recent Labs Lab 06/19/16 0157 06/20/16 0405 06/21/16 0339 06/21/16 0857 06/22/16 0824 06/23/16 0313  NA 131* 134* 137  --  136 137  K 4.4 4.4 3.1*  --  4.0 4.1  CL 101 104 114*  --  100* 97*  CO2 21* 20* 18*  --  30 31  GLUCOSE 236* 191* 127*  --  150* 109*  BUN 16 16 15   --  13 11  CREATININE 1.46* 1.19* 0.92  --  0.78 0.90  CALCIUM 8.9 9.3 6.4*  --  8.4* 8.8*  MG  --   --   --  1.6* 1.9 1.8  PHOS  --   --   --  2.5  --   --    GFR: Estimated Creatinine Clearance: 30.6 mL/min (by C-G formula based on SCr of 0.9 mg/dL). Liver Function Tests:  Recent Labs Lab 06/19/16 0157 06/21/16 0857  AST  --  24  ALT  --  12*  ALKPHOS  --  46  BILITOT  --  0.4  PROT  --  6.0*  ALBUMIN 3.9 2.9*   No results for input(s): LIPASE, AMYLASE in the last 168 hours. No results for input(s): AMMONIA in the last 168 hours. Coagulation Profile:  Recent Labs Lab 06/18/16 1434 06/19/16 0157  INR 0.98 1.03   Cardiac Enzymes:  Recent Labs Lab 06/18/16 2057 06/19/16 0157 06/19/16 0806 06/19/16 1345 06/19/16 2008  TROPONINI 0.03* 0.09* 0.11* 0.11* 0.10*   BNP (last 3 results) No results for input(s): PROBNP in the last 8760 hours. HbA1C: No results for input(s): HGBA1C in the last 72 hours. CBG:  Recent Labs Lab 06/22/16 1601 06/22/16 1944 06/22/16 2348 06/23/16 0317 06/23/16 0730  GLUCAP 170* 99 105* 111* 107*   Lipid Profile: No results for input(s): CHOL, HDL, LDLCALC, TRIG, CHOLHDL, LDLDIRECT in the last 72 hours. Thyroid Function Tests: No results for input(s): TSH, T4TOTAL, FREET4, T3FREE, THYROIDAB in the last 72 hours. Anemia Panel:  Recent Labs  06/21/16 0857  VITAMINB12 122*  FOLATE 2.9*  FERRITIN 191   TIBC 248*  IRON 24*  RETICCTPCT 2.3   Sepsis Labs:  Recent Labs Lab 06/18/16 1658  LATICACIDVEN 1.75    Recent Results (from the past 240 hour(s))  Urine culture     Status: Abnormal   Collection Time: 06/18/16  6:10 PM  Result Value Ref Range Status   Specimen Description URINE, RANDOM  Final   Special Requests NONE  Final   Culture >=100,000 COLONIES/mL ESCHERICHIA COLI (A)  Final   Report Status 06/21/2016 FINAL  Final   Organism ID, Bacteria ESCHERICHIA COLI (A)  Final  Susceptibility   Escherichia coli - MIC*    AMPICILLIN >=32 RESISTANT Resistant     CEFAZOLIN <=4 SENSITIVE Sensitive     CEFTRIAXONE <=1 SENSITIVE Sensitive     CIPROFLOXACIN <=0.25 SENSITIVE Sensitive     GENTAMICIN <=1 SENSITIVE Sensitive     IMIPENEM <=0.25 SENSITIVE Sensitive     NITROFURANTOIN 64 INTERMEDIATE Intermediate     TRIMETH/SULFA >=320 RESISTANT Resistant     AMPICILLIN/SULBACTAM 16 INTERMEDIATE Intermediate     PIP/TAZO <=4 SENSITIVE Sensitive     Extended ESBL NEGATIVE Sensitive     * >=100,000 COLONIES/mL ESCHERICHIA COLI  MRSA PCR Screening     Status: None   Collection Time: 06/18/16  8:48 PM  Result Value Ref Range Status   MRSA by PCR NEGATIVE NEGATIVE Final    Comment:        The GeneXpert MRSA Assay (FDA approved for NASAL specimens only), is one component of a comprehensive MRSA colonization surveillance program. It is not intended to diagnose MRSA infection nor to guide or monitor treatment for MRSA infections.          Radiology Studies: Dg Chest Port 1 View  Result Date: 06/21/2016 CLINICAL DATA:  PICC line placement EXAM: PORTABLE CHEST 1 VIEW COMPARISON:  06/18/2016 FINDINGS: At sided PICC line tip: Lower SVC. No pneumothorax or complicating feature. There is obscuration of the right hemidiaphragm possibly from right lower lobe atelectasis. Minimal obscuration of the left hemidiaphragm medially. Atherosclerotic calcification of the aorta. Vertebral  augmentation near the thoracolumbar junction. IMPRESSION: 1. Right PICC line tip:  Lower SVC.  No pneumothorax. 2. Indistinct airspace opacities at both lung bases, right greater than left, favoring atelectasis. 3. Atherosclerosis. Electronically Signed   By: Van Clines M.D.   On: 06/21/2016 17:01        Scheduled Meds: . ALPRAZolam  1 mg Oral QHS  . apixaban  2.5 mg Oral BID  . calcium citrate  400 mg of elemental calcium Oral QID  . cefUROXime  250 mg Oral BID WC  . cholecalciferol  1,000 Units Oral Daily  . cyanocobalamin  1,000 mcg Intramuscular Daily  . diltiazem  120 mg Oral Daily  . famotidine  10 mg Oral Daily  . folic acid  1 mg Oral Daily  . insulin aspart  0-9 Units Subcutaneous Q4H  . insulin glargine  5 Units Subcutaneous Daily  . levothyroxine  100 mcg Oral Once per day on Sun Tue Thu Fri Sat  . loratadine  10 mg Oral Daily  . senna-docusate  1 tablet Oral BID  . sodium chloride flush  10-40 mL Intracatheter Q12H  . traMADol-acetaminophen  1 tablet Oral BID   Continuous Infusions:    LOS: 5 days    Time spent: 55 mins    THOMPSON,DANIEL, MD [facility] Pager [phone]  If 7PM-7AM, please contact night-coverage [email or web address] Password [facility] 06/23/2016, 9:57 AM

## 2016-06-23 NOTE — Progress Notes (Signed)
Patient Name: LEARLINE FREEBERG Date of Encounter: 06/23/2016  Primary Cardiologist: Dr [facility] Problem List     Principal Problem:   Closed left hip fracture [facility]) Active Problems:   UTI (urinary tract infection)   Atrial flutter with rapid ventricular response (HCC)   Dementia   Hypothyroidism   Hyperglycemia   DM (diabetes mellitus), type 2 ([place])   Fall at home   RBBB   Elevated troponin   Closed subcapital fracture of left femur (HCC)   Left displaced femoral neck fracture (HCC)   Hypocalcemia   Hypokalemia   Iron deficiency anemia   Anemia due to vitamin B12 deficiency   Anemia due to folic acid deficiency   Aortic stenosis, moderate: Per 2 D echo 06/22/2016     Subjective   Sleepy confused, not oriented to place, day. Denies pain. Dtr in room, pt walks a little in the house, does not do much else. Eating poorly  Inpatient Medications    Scheduled Meds: . ALPRAZolam  1 mg Oral QHS  . apixaban  2.5 mg Oral BID  . calcium citrate  400 mg of elemental calcium Oral QID  . cefUROXime  250 mg Oral BID WC  . cholecalciferol  1,000 Units Oral Daily  . cyanocobalamin  1,000 mcg Intramuscular Daily  . diltiazem  30 mg Oral Q6H  . famotidine  10 mg Oral Daily  . folic acid  1 mg Oral Daily  . insulin aspart  0-9 Units Subcutaneous Q4H  . insulin glargine  5 Units Subcutaneous Daily  . levothyroxine  100 mcg Oral Once per day on Sun Tue Thu Fri Sat  . loratadine  10 mg Oral Daily  . senna-docusate  1 tablet Oral BID  . sodium chloride flush  10-40 mL Intracatheter Q12H  . traMADol-acetaminophen  1 tablet Oral BID   Continuous Infusions: .  sodium bicarbonate 150 mEq in sterile water 1000 mL infusion 75 mL/hr at 06/22/16 1800   PRN Meds: acetaminophen **OR** acetaminophen, bisacodyl, HYDROcodone-acetaminophen, menthol-cetylpyridinium **OR** phenol, methocarbamol **OR** methocarbamol (ROBAXIN)  IV, metoCLOPramide **OR** metoCLOPramide (REGLAN) injection,  morphine injection, ondansetron **OR** ondansetron (ZOFRAN) IV, polyethylene glycol, sodium chloride flush   Vital Signs    Vitals:   06/23/16 0400 06/23/16 0500 06/23/16 0600 06/23/16 0700  BP: (!) 111/49 (!) 122/54 (!) 97/51 (!) 147/81  Pulse: 97 94 94 92  Resp: 19 18 18 18   Temp:    98 F (36.7 C)  TempSrc:      SpO2: 100% 100% 100% 100%  Weight:      Height:        Intake/Output Summary (Last 24 hours) at 06/23/16 0759 Last data filed at 06/23/16 0600  Gross per 24 hour  Intake             2025 ml  Output             3325 ml  Net            -1300 ml   Filed Weights   06/18/16 2200  Weight: 127 lb 13.9 oz (58 kg)    Physical Exam    GEN: Well nourished, frail elderly female, in no acute distress.  HEENT: Grossly normal.  Neck: Supple, minimal JVD, carotid bruits, or masses. Cardiac: Irreg R&R, 2/6 murmurs, rubs, or gallops. No clubbing, cyanosis, edema.  Radials/DP/PT 2+ and equal bilaterally.  Respiratory:  Respirations regular and unlabored, decreased BS bases bilaterally. Poor inspiratory effort GI: Soft, nontender, nondistended,  BS + x 4. MS: no deformity or atrophy. Skin: warm and dry, no rash. Neuro:  Strength and sensation are intact.  Labs    CBC  Recent Labs  06/22/16 0824 06/23/16 0313  WBC 10.9* 11.6*  HGB 11.5* 11.9*  HCT 32.0* 33.5*  MCV 95.0 97.1  PLT 200 99991111   Basic Metabolic Panel  Recent Labs  06/21/16 0857 06/22/16 0824 06/23/16 0313  NA  --  136 137  K  --  4.0 4.1  CL  --  100* 97*  CO2  --  30 31  GLUCOSE  --  150* 109*  BUN  --  13 11  CREATININE  --  0.78 0.90  CALCIUM  --  8.4* 8.8*  MG 1.6* 1.9 1.8  PHOS 2.5  --   --    Liver Function Tests  Recent Labs  06/21/16 0857  AST 24  ALT 12*  ALKPHOS 46  BILITOT 0.4  PROT 6.0*  ALBUMIN 2.9*    Telemetry    Atrial flutter, occ PVCs - Personally Reviewed  ECG    N/a - Personally Reviewed  Radiology    Dg Chest Port 1 View Result Date:  06/21/2016 CLINICAL DATA:  PICC line placement EXAM: PORTABLE CHEST 1 VIEW COMPARISON:  06/18/2016 FINDINGS: At sided PICC line tip: Lower SVC. No pneumothorax or complicating feature. There is obscuration of the right hemidiaphragm possibly from right lower lobe atelectasis. Minimal obscuration of the left hemidiaphragm medially. Atherosclerotic calcification of the aorta. Vertebral augmentation near the thoracolumbar junction. IMPRESSION: 1. Right PICC line tip:  Lower SVC.  No pneumothorax. 2. Indistinct airspace opacities at both lung bases, right greater than left, favoring atelectasis. 3. Atherosclerosis. Electronically Signed   By: Van Clines M.D.   On: 06/21/2016 17:01    Cardiac Studies   ECHO: 12/25 - Left ventricle: The cavity size was normal. Wall thickness was   normal. Systolic function was normal. The estimated ejection   fraction was in the range of 60% to 65%. Wall motion was normal;   there were no regional wall motion abnormalities. The study is   not technically sufficient to allow evaluation of LV diastolic   function. - Aortic valve: Calcified with restricted leaflet excursion. There   appears visually to be a degree of aortic stenosis - possibly   moderate. Dopplers were inaccurate of the LVOT and aortic valve   to calculate the gradient. Would recommend a repeat limited study   to assess the aortic valve. - Mitral valve: Calcified annulus. Calcified leaflet tips. Mild   regurgitation. - Left atrium: The atrium was normal in size. - Right ventricle: The cavity size was mildly dilated. Systolic   function was normal. - Right atrium: The atrium was at the upper limits of normal in   size. - Tricuspid valve: There was moderate regurgitation. - Pulmonary arteries: PA peak pressure: 53 mm Hg (S). - Systemic veins: The IVC measures <2.1 cm, but does not collapse   >50%, suggesting an elevated RA pressure of 8 mmHg. Impressions: - LVEF 60-65%, normal wall  thickness, normal wall motion, calcified   aortic valve leaflets with what appears to be aortic stenosis -   cannot accurately quantify due to lack of adequate dopplers, MAC   with mild MR, normal LA size, upper normal RA size, moderate TR,   RVSP 53 mmHg, dilated IVC suggestive of RA pressure of 8 mmHg.   Would advise a repeat limited echo with better dopplers through  the aortic valve and LVOT to assess for the degree of aortic   stenosis.   Patient Profile     82 year old woman with history of dementia, diabetes mellitus, GERD, hypothyroidism, presenting status post fall with left hip fracture.She underwent left hip hemiarthroplasty on December 23. Seen for preoperative cardiology consultation by Dr. Acie Fredrickson  Assessment & Plan    1. Atypical atrial flutter with variable conduction, duration uncertain.  - Heart rate controlled on intravenous diltiazem>>po on 12/25.  - Has had 4 doses now, can change to Cardizem CD 120 mg and up-titrate prn. - CHADSVASC score is 4, felt not to be optimal long-term anticoagulation candidate at this point.  - Dr Acie Fredrickson discussed chronic anticoagulation with the daughter. They agree that the patient is at high risk for falls and may not be a good candidate for chronic anti-coagulation. We can assess this further following her surgery.  2. Postoperative day 4 status post left hip hemiarthroplasty. Per IM/Orthopedics - Started on Eliquis for limited course.  3. Aortic stenosis - see echo report above - PAS 53, but no SOB and only minimal elevation in neck veins.  - Dtr does not feel pt a candidate for procedures, will not assess valve further at this time.  Otherwise, per IM/Ortho, MD advise on decreasing IVF.    Signed, Rosaria Ferries, PA-C  06/23/2016, 7:59 AM   Attending Note:   The patient was seen and examined.  Agree with assessment and plan as noted above.  Changes made to the above note as needed.  Patient seen and independently  examined with Rosaria Ferries, PA .   We discussed all aspects of the encounter. I agree with the assessment and plan as stated above.  See progress note from same day     I have spent a total of 40 minutes with patient reviewing hospital  notes , telemetry, EKGs, labs and examining patient as well as establishing an assessment and plan that was discussed with the patient. > 50% of time was spent in direct patient care.    Thayer Headings, Brooke Bonito., MD, [facility] 06/23/2016, 3:22 PM [address]  [place] Pager [phone]

## 2016-06-23 NOTE — Progress Notes (Signed)
   Subjective:  Patient reports pain as mild to moderate.  Delirium o/n.  Objective:   VITALS:   Vitals:   06/23/16 0400 06/23/16 0500 06/23/16 0600 06/23/16 0700  BP: (!) 111/49 (!) 122/54 (!) 97/51 (!) 147/81  Pulse: 97 94 94 92  Resp: 19 18 18 18   Temp:    98 F (36.7 C)  TempSrc:      SpO2: 100% 100% 100% 100%  Weight:      Height:       NAD, confused ABD soft Sensation intact distally Intact pulses distally Dorsiflexion/Plantar flexion intact Incision: dressing C/D/I Compartment soft  Lab Results  Component Value Date   WBC 11.6 (H) 06/23/2016   HGB 11.9 (L) 06/23/2016   HCT 33.5 (L) 06/23/2016   MCV 97.1 06/23/2016   PLT 190 06/23/2016   BMET    Component Value Date/Time   NA 137 06/23/2016 0313   K 4.1 06/23/2016 0313   CL 97 (L) 06/23/2016 0313   CO2 31 06/23/2016 0313   GLUCOSE 109 (H) 06/23/2016 0313   BUN 11 06/23/2016 0313   CREATININE 0.90 06/23/2016 0313   CALCIUM 8.8 (L) 06/23/2016 0313   GFRNONAA 53 (L) 06/23/2016 0313   GFRAA >60 06/23/2016 0313     Assessment/Plan: 3 Days Post-Op   Principal Problem:   Closed left hip fracture (HCC) Active Problems:   UTI (urinary tract infection)   Atrial flutter with rapid ventricular response (HCC)   Dementia   Hypothyroidism   Hyperglycemia   DM (diabetes mellitus), type 2 ([place])   Fall at home   RBBB   Elevated troponin   Closed subcapital fracture of left femur (HCC)   Left displaced femoral neck fracture (HCC)   Hypocalcemia   Hypokalemia   Iron deficiency anemia   Anemia due to vitamin B12 deficiency   Anemia due to folic acid deficiency   WBAT with walker DVT ppx: apixaban for 30 days - long term anticoag not recommended by cardiology due to risk of falls, SCDs, TEDs PT/OT D/C planning, SNF placement   Brittany Chambers, Horald Pollen 06/23/2016, 7:55 AM   Rod Can, MD Cell [phone]

## 2016-06-23 NOTE — Clinical Social Work Note (Signed)
Clinical Social Work Assessment  Patient Details  Name: Brittany Chambers MRN: [id number] Date of Birth: 14-Apr-1919  Date of referral:  06/23/16               Reason for consult:  Facility Placement                Permission sought to share information with:  Family Supports, Chartered certified accountant granted to share information::     Name::        Agency::     Relationship::  Daughter   Contact Information:     Housing/Transportation Living arrangements for the past 2 months:  Single Family Home Source of Information:  Adult Children Patient Interpreter Needed:  None Criminal Activity/Legal Involvement Pertinent to Current Situation/Hospitalization:  No - Comment as needed Significant Relationships:  Adult Children Lives with:  Adult Children Do you feel safe going back to the place where you live?  Yes Need for family participation in patient care:  Yes (Comment)  Care giving concerns:  Patient will need rehab care for going home.    Social Worker assessment / plan:  LCSWA met with patient and daughter-POA at bedside, patient lethargic during assessment. LCSWA explained cousult-to assist with placement. Patient daughter reports she has been caring for patient home. Patient did well with ambulating at home before fall. Patient will need physical therapy before retuning home.  Plan: Complete FL2, PASSR.  Employment status:  Retired Nurse, adult PT Recommendations:  Research officer, trade union, [place] / Referral to community resources:  [place]  Patient/Family's Response to care:  Agreeable to care.   Patient/Family's Understanding of and Emotional Response to Diagnosis, Current Treatment, and Prognosis:  "I care for my mother at home, she does well. This fall will probably make things different." "Patietn daughter is aware and very involved in patient care.   Emotional Assessment Appearance:   Appears stated age Attitude/Demeanor/Rapport:    Affect (typically observed):    Orientation:  Oriented to Self, Oriented to Place Alcohol / Substance use:  Not Applicable Psych involvement (Current and /or in the community):  No (Comment)  Discharge Needs  Concerns to be addressed:  Discharge Planning Concerns, Care Coordination Readmission within the last 30 days:  No Current discharge risk:  None Barriers to Discharge:  Continued Medical Work up   [employer], LCSW 06/23/2016, 2:35 PM

## 2016-06-23 NOTE — NC FL2 (Signed)
[place] LEVEL OF CARE SCREENING TOOL     IDENTIFICATION  Patient Name: Brittany Chambers Birthdate: September 02, 1918 Sex: female Admission Date (Current Location): 06/18/2016  [facility] and [place] Number:  Herbalist and Address:  [facility],  [place] [address], [place]      Provider Number: [id number]  Attending Physician Name and Address:  Eugenie Filler, MD  Relative Name and Phone Number:       Current Level of Care: SNF Recommended Level of Care: [place] Prior Approval Number:    Date Approved/Denied:   PASRR Number: [id number]  Discharge Plan: SNF    Current Diagnoses: Patient Active Problem List   Diagnosis Date Noted  . Aortic stenosis, moderate: Per 2 D echo 06/22/2016 06/23/2016  . Atrial fibrillation with RVR ([place])   . Iron deficiency anemia   . Anemia due to vitamin B12 deficiency   . Anemia due to folic acid deficiency   . Hypocalcemia 06/21/2016  . Hypokalemia 06/21/2016  . Left displaced femoral neck fracture ([place]) 06/20/2016  . Fall at home 06/19/2016  . RBBB 06/19/2016  . Elevated troponin 06/19/2016  . Closed subcapital fracture of left femur ([place])   . Closed left hip fracture ([place]) 06/18/2016  . UTI (urinary tract infection) 06/18/2016  . Atrial flutter with rapid ventricular response ([place]) 06/18/2016  . Dementia 06/18/2016  . Hypothyroidism 06/18/2016  . Hyperglycemia 06/18/2016  . DM (diabetes mellitus), type 2 ([place]) 06/18/2016    Orientation RESPIRATION BLADDER Height & Weight     Self  Normal Indwelling catheter Weight: 127 lb 13.9 oz (58 kg) Height:  5\' 1"  (154.9 cm)  BEHAVIORAL SYMPTOMS/MOOD NEUROLOGICAL BOWEL NUTRITION STATUS      Incontinent Diet  AMBULATORY STATUS COMMUNICATION OF NEEDS Skin   Extensive Assist Verbally Normal                       Personal Care Assistance Level of Assistance  Bathing, Dressing, Feeding Bathing Assistance: Limited  assistance Feeding assistance: Independent Dressing Assistance: Limited assistance     Functional Limitations Info  Sight, Hearing, Speech Sight Info: Adequate Hearing Info: Adequate Speech Info: Adequate    SPECIAL CARE FACTORS FREQUENCY  PT (By licensed PT), OT (By licensed OT)     PT Frequency: 5 OT Frequency: 5            Contractures Contractures Info: Not present    Additional Factors Info  Code Status, Allergies Code Status Info: DNR Allergies Info: No Known Allergies           Current Medications (06/23/2016):  This is the current hospital active medication list Current Facility-Administered Medications  Medication Dose Route Frequency Provider Last Rate Last Dose  . acetaminophen (TYLENOL) tablet 650 mg  650 mg Oral Q6H PRN Rod Can, MD       Or  . acetaminophen (TYLENOL) suppository 650 mg  650 mg Rectal Q6H PRN Rod Can, MD      . ALPRAZolam Duanne Moron) tablet 1 mg  1 mg Oral QHS Toy Baker, MD   1 mg at 06/22/16 2204  . apixaban (ELIQUIS) tablet 2.5 mg  2.5 mg Oral BID Rod Can, MD   2.5 mg at 06/23/16 0835  . bisacodyl (DULCOLAX) suppository 10 mg  10 mg Rectal Daily PRN Toy Baker, MD      . calcium citrate (CALCITRATE - dosed in mg elemental calcium) tablet 400 mg of elemental calcium  400 mg of elemental calcium Oral QID Eugenie Filler, MD   400 mg of elemental calcium at 06/23/16 0836  . cefUROXime (CEFTIN) tablet 250 mg  250 mg Oral BID WC Eugenie Filler, MD   250 mg at 06/23/16 0835  . cholecalciferol (VITAMIN D) tablet 1,000 Units  1,000 Units Oral Daily Eugenie Filler, MD   1,000 Units at 06/23/16 [phone]  . cyanocobalamin ((VITAMIN B-12)) injection 1,000 mcg  1,000 mcg Intramuscular Daily Eugenie Filler, MD   1,000 mcg at 06/23/16 [phone]  . diltiazem (CARDIZEM CD) 24 hr capsule 120 mg  120 mg Oral Daily Rhonda G Barrett, PA-C   120 mg at 06/23/16 1053  . famotidine (PEPCID) tablet 10 mg  10 mg Oral Daily  Toy Baker, MD   10 mg at 06/23/16 0836  . folic acid (FOLVITE) tablet 1 mg  1 mg Oral Daily Eugenie Filler, MD   1 mg at 06/23/16 [phone]  . HYDROcodone-acetaminophen (NORCO/VICODIN) 5-325 MG per tablet 1-2 tablet  1-2 tablet Oral Q6H PRN Toy Baker, MD   1 tablet at 06/20/16 1726  . insulin aspart (novoLOG) injection 0-9 Units  0-9 Units Subcutaneous Q4H Toy Baker, MD   Stopped at 06/22/16 2000  . insulin glargine (LANTUS) injection 5 Units  5 Units Subcutaneous Daily Eugenie Filler, MD   5 Units at 06/23/16 [phone]  . levothyroxine (SYNTHROID, LEVOTHROID) tablet 100 mcg  100 mcg Oral Once per day on Sun Tue Thu Fri Sat Toy Baker, MD   100 mcg at 06/23/16 [id number]  . loratadine (CLARITIN) tablet 10 mg  10 mg Oral Daily Toy Baker, MD   10 mg at 06/23/16 0835  . menthol-cetylpyridinium (CEPACOL) lozenge 3 mg  1 lozenge Oral PRN Rod Can, MD       Or  . phenol (CHLORASEPTIC) mouth spray 1 spray  1 spray Mouth/Throat PRN Rod Can, MD      . methocarbamol (ROBAXIN) tablet 500 mg  500 mg Oral Q6H PRN Toy Baker, MD       Or  . methocarbamol (ROBAXIN) 500 mg in dextrose 5 % 50 mL IVPB  500 mg Intravenous Q6H PRN Toy Baker, MD      . metoCLOPramide (REGLAN) tablet 5-10 mg  5-10 mg Oral Q8H PRN Rod Can, MD       Or  . metoCLOPramide (REGLAN) injection 5-10 mg  5-10 mg Intravenous Q8H PRN Rod Can, MD      . morphine 2 MG/ML injection 0.5 mg  0.5 mg Intravenous Q2H PRN Toy Baker, MD   0.5 mg at 06/18/16 2121  . ondansetron (ZOFRAN) tablet 4 mg  4 mg Oral Q6H PRN Rod Can, MD       Or  . ondansetron (ZOFRAN) injection 4 mg  4 mg Intravenous Q6H PRN Rod Can, MD      . polyethylene glycol (MIRALAX / GLYCOLAX) packet 17 g  17 g Oral Daily PRN Toy Baker, MD      . senna-docusate (Senokot-S) tablet 1 tablet  1 tablet Oral BID Eugenie Filler, MD   1 tablet at 06/23/16 [phone]  . sodium chloride  flush (NS) 0.9 % injection 10-40 mL  10-40 mL Intracatheter Q12H Eugenie Filler, MD   10 mL at 06/23/16 1000  . sodium chloride flush (NS) 0.9 % injection 10-40 mL  10-40 mL Intracatheter PRN Eugenie Filler, MD      . traMADol-acetaminophen (ULTRACET) 37.5-325 MG per tablet 1 tablet  1 tablet Oral BID Toy Baker, MD   1 tablet at 06/23/16 [id number]     Discharge Medications: Please see discharge summary for a list of discharge medications.  Relevant Imaging Results:  Relevant Lab Results:   Additional Information SSN:[id number]  Etoy Mcdonnell A Susan Arana, LCSW

## 2016-06-24 LAB — BASIC METABOLIC PANEL
Anion gap: 8 (ref 5–15)
BUN: 13 mg/dL (ref 6–20)
CALCIUM: 9 mg/dL (ref 8.9–10.3)
CHLORIDE: 97 mmol/L — AB (ref 101–111)
CO2: 33 mmol/L — ABNORMAL HIGH (ref 22–32)
CREATININE: 0.84 mg/dL (ref 0.44–1.00)
GFR calc Af Amer: >60 mL/min (ref >60)
GFR calc non Af Amer: 57 mL/min — ABNORMAL LOW (ref >60)
Glucose, Bld: 126 mg/dL — ABNORMAL HIGH (ref 65–99)
Potassium: 3.9 mmol/L (ref 3.5–5.1)
SODIUM: 138 mmol/L (ref 135–145)

## 2016-06-24 LAB — CBC WITH DIFFERENTIAL/PLATELET
Basophils Absolute: 0 10*3/uL (ref 0.0–0.1)
Basophils Relative: 0 %
EOS ABS: 0.3 10*3/uL (ref 0.0–0.7)
EOS PCT: 3 %
HCT: 32.4 % — ABNORMAL LOW (ref 36.0–46.0)
Hemoglobin: 11.3 g/dL — ABNORMAL LOW (ref 12.0–15.0)
Lymphocytes Relative: 12 %
Lymphs Abs: 1.3 10*3/uL (ref 0.7–4.0)
MCH: 34.3 pg — AB (ref 26.0–34.0)
MCHC: 34.9 g/dL (ref 30.0–36.0)
MCV: 98.5 fL (ref 78.0–100.0)
Monocytes Absolute: 1 10*3/uL (ref 0.1–1.0)
Monocytes Relative: 10 %
Neutro Abs: 8 10*3/uL — ABNORMAL HIGH (ref 1.7–7.7)
Neutrophils Relative %: 75 %
PLATELETS: 235 10*3/uL (ref 150–400)
RBC: 3.29 MIL/uL — AB (ref 3.87–5.11)
RDW: 14.1 % (ref 11.5–15.5)
WBC: 10.6 10*3/uL — AB (ref 4.0–10.5)

## 2016-06-24 LAB — GLUCOSE, CAPILLARY
GLUCOSE-CAPILLARY: 123 mg/dL — AB (ref 65–99)
GLUCOSE-CAPILLARY: 151 mg/dL — AB (ref 65–99)
Glucose-Capillary: 121 mg/dL — ABNORMAL HIGH (ref 65–99)
Glucose-Capillary: 126 mg/dL — ABNORMAL HIGH (ref 65–99)
Glucose-Capillary: 128 mg/dL — ABNORMAL HIGH (ref 65–99)
Glucose-Capillary: 143 mg/dL — ABNORMAL HIGH (ref 65–99)

## 2016-06-24 MED ORDER — METOPROLOL TARTRATE 5 MG/5ML IV SOLN
5.0000 mg | Freq: Once | INTRAVENOUS | Status: AC
  Administered 2016-06-24: 5 mg via INTRAVENOUS
  Filled 2016-06-24: qty 5

## 2016-06-24 MED ORDER — INSULIN ASPART 100 UNIT/ML ~~LOC~~ SOLN: 0.0000 [IU] | Freq: Three times a day (TID) | SUBCUTANEOUS | Status: DC

## 2016-06-24 MED ORDER — DILTIAZEM HCL ER COATED BEADS 180 MG PO CP24
180.0000 mg | ORAL_CAPSULE | Freq: Every day | ORAL | Status: DC
  Administered 2016-06-24 – 2016-06-25 (×2): 180 mg via ORAL
  Filled 2016-06-24 (×2): qty 1

## 2016-06-24 MED ORDER — VITAMIN B-12 1000 MCG PO TABS
1000.0000 ug | ORAL_TABLET | Freq: Every day | ORAL | Status: DC
  Administered 2016-06-25: 1000 ug via ORAL
  Filled 2016-06-24: qty 1

## 2016-06-24 NOTE — Consult Note (Signed)
   [facility] Inpatient Consult   06/24/2016  Brittany Chambers [birth year] [id number]   Patient screened for potential [facility] Care Management services. Chart reviewed. Noted current discharge plan is for SNF.  There are no identifiable [facility] Care Management needs at this time. If patient's post hospital needs change, please place a [facility] Care Management consult. For questions please contact:  Marthenia Rolling, [place], RN,BSN [facility] Liaison [phone]

## 2016-06-24 NOTE — Progress Notes (Signed)
PROGRESS NOTE    Brittany Chambers  [id number] DOB: June 10, 1919 DOA: 06/18/2016 PCP: Leeroy Cha, MD    Brief Narrative:  Patient is a 82 year old lady history of dementia, recurrent cataracts, gastroesophageal reflux disease, hypothyroidism, failure to thrive who presented to the ED after a fall while using the bathroom. Patient noted to have a left hip fracture. Patient also noted to be significantly hypotensive and in a flutter with RVR. First set of troponins mildly elevated. Patient placed on a Cardizem drip in place in the step down unit. Cardiology consulted for preop clearance and management of A. fib and elevated troponins. Patient s/p hip repair 06/20/2016.   Assessment & Plan:   Principal Problem:   Closed left hip fracture (HCC) Active Problems:   UTI (urinary tract infection)   Atrial flutter with rapid ventricular response (HCC)   Dementia   Hypothyroidism   Hyperglycemia   DM (diabetes mellitus), type 2 ([place])   Fall at home   RBBB   Elevated troponin   Closed subcapital fracture of left femur (HCC)   Left displaced femoral neck fracture (HCC)   Hypocalcemia   Hypokalemia   Iron deficiency anemia   Anemia due to vitamin B12 deficiency   Anemia due to folic acid deficiency   Aortic stenosis, moderate: Per 2 D echo 06/22/2016   Atrial fibrillation with RVR ([place])  #1 closed left hip fracture Likely secondary to mechanical fall patient sustained while going to the bathroom. Patient has been seen in consultation by orthopedics and patient status post left hip hemiarthroplasty 06/20/2016 per Dr. Lyla Glassing. 2-D echo EF 60-65%.   Per cardiology feels patient has good LV function based on normal blood pressure, heart rate and good pulses and feel not necessary to obtain echo prior to surgery. Patient was seen by cardiology preoperatively. Per orthopedics, started eliquis for DVT prophylaxis x 1 month.  Orthopedics following.  #2 atrial flutter with RVR  CHA2DS2VASC = 4 Patient noted to be in a flutter with RVR on admission and placed on a Cardizem drip  . Cardiology recommended addition of low-dose beta blocker which was started last evening with Coreg 3.125 mg twice a day. Coreg was subsequently discontinued secondary to borderline blood pressure with systolic blood pressures in the 90s. Patient not on anticoagulation and patient high risk for falls and as such likely not a good long-term candidate for chronic anticoagulation. Patient been transitioned off Cardizem drip to oral Cardizem  increase dose to 180 mg/24 hours.  Cardiology following and appreciate input and recommendations.  #3 dementia Stable. Will need SNF  #4 Escherichia coli urinary tract infection Patient afebrile. WBC trending down. Transitioned from IV Rocephin to oral Ceftin to complete course of antibiotic treatment. Once antibiotic treatment has been completed will resume patient's prophylactic antibiotics.   #5 diabetes mellitus type 2 Blood glucose in the 200s on admission. CBGs currently ranging from 105-111. Continue low-dose Lantus 5 units daily. Continue sliding scale insulin.  #6 elevated troponin May be secondary to demand ischemia secondary to fall versus NSTEMI. Cardiac enzymes mildly elevated to 0.11 which seems to have plateaued. Patient denied any chest pain. 2-D echo with EF of 60-65%, no wall motion abnormalities, moderate aortic stenosis.  Patient was started on Coreg, however due to systolic blood pressures in the 80s and 90s Coreg was discontinued.  Cardiology following and appreciate input and recommendations.  #7 moderate aortic stenosis Per 2-D echo. Cardiology has discussed with family who at this time is not interested in any  invasive measures/procedures. No further evaluation needed per cardiology.  #8 hypothyroidism TSH at 5.188. Patient was noted to be taking Synthroid daily except on 2 days during the week. Continue Synthroid on a daily basis and  will need repeat thyroid function studies done in about 4-6 weeks.  #9 right bundle branch block  #10 Fall Questionable etiology. Mechanical versus secondary to UTI versus secondary to elevated troponins. Urine cultures pending. Patient on empiric IV antibiotics. Cardiac enzymes elevated up to 0.11 which seems to have plateaued.  2-D echo 60-65%, no wall motion abnormality, moderate aortic stenosis. Cardiology following. PT/OT.  #11 hypokalemia Magnesium level was 1.6. Repleted.   #12 iron deficiency anemia/acute blood loss anemia/folic acid [id number] deficiency Patient with no overt bleeding. Hemoglobin currently at 11.3 from   14.3 on admission. Likely postop acute blood loss anemia. Anemia panel consistent with iron deficiency anemia, folate deficiency, B-12 deficiency. Continue vitamin B-12 1000 MCG's IM daily, folic acid 1 mg by mouth daily, s/p IV iron for pharmacy. Transfusion threshold hemoglobin less than 7.  #13 hypocalcemia Questionable etiology. Likely secondary to vitamin D deficiency. Vitamin D levels on the low end of normal concern for vitamin D deficiency. PTH, intact calcium pending. Magnesium level was low at 1.6 and phosphorus on borderline low at 2.5. Received IV calcium gluconate 1. Hypocalcemia improving on supplementation. Continue calcium citrate 400 mg 4 times daily and vitamin D 1000 international units needs daily.    DVT prophylaxis: SCDs Code Status: DO NOT RESUSCITATE Family Communication: Updated patient and daughter at bedside. Disposition Plan: Transient to telemetry. Likely skilled nursing facility versus home with home health  in 1-2 days   Consultants:   Orthopedics: Dr. Alvan Dame 06/18/2016  Cardiology: Dr. Acie Fredrickson 06/19/2016  Procedures:   Chest x-ray 06/18/2016  Plain films of the left hip 06/18/2016  Plain. The left tib-fib 06/18/2016  Left hip hemiarthroplasty 06/20/2016 per Dr.Swinteck  Antimicrobials:   IV Rocephin  06/18/2016>>>>>06/21/2016  Oral Ceftin 06/21/2016>>>>>>06/25/2016   Subjective:  Remains in atrial flutter with a rate of 120 to 150's   Objective: Vitals:   06/23/16 1600 06/23/16 1734 06/23/16 2014 06/24/16 0413  BP: (!) 107/52 117/84 125/65 117/76  Pulse: 89 (!) 106 (!) 106 88  Resp: 17 18 18 18   Temp: 98.2 F (36.8 C) 98.2 F (36.8 C) 97.6 F (36.4 C) 98 F (36.7 C)  TempSrc:  Oral Axillary Axillary  SpO2: 100% 94% 96% 93%  Weight:      Height:        Intake/Output Summary (Last 24 hours) at 06/24/16 0750 Last data filed at 06/24/16 0419  Gross per 24 hour  Intake              140 ml  Output              750 ml  Net             -610 ml   Filed Weights   06/18/16 2200  Weight: 58 kg (127 lb 13.9 oz)    Examination:  General exam: In bed.Sleeping. Respiratory system: Clear to auscultation anterior lung fields.Marland Kitchen Respiratory effort normal. Cardiovascular system: Irregularly irregular. No JVD, murmurs, rubs, gallops or clicks. No pedal edema. Gastrointestinal system: Abdomen is nondistended, soft and nontender. No organomegaly or masses felt. Normal bowel sounds heard. Central nervous system: Sleeping. No focal neurological deficits. Extremities: No c/c/e Skin: No rashes, lesions or ulcers Psychiatry: Judgement and insight poor-fair. Mood & affect appropriate.     Data Reviewed:  I have personally reviewed following labs and imaging studies  CBC:  Recent Labs Lab 06/18/16 1434  06/20/16 0405 06/21/16 0339 06/22/16 0824 06/23/16 0313 06/24/16 0337  WBC 7.4  < > 13.2* 8.7 10.9* 11.6* 10.6*  NEUTROABS 5.3  --  11.4*  --   --   --  8.0*  HGB 15.8*  < > 14.3 9.7* 11.5* 11.9* 11.3*  HCT 45.5  < > 40.2 27.5* 32.0* 33.5* 32.4*  MCV 95.0  < > 95.7 96.8 95.0 97.1 98.5  PLT 172  < > 174 106* 200 190 235  < > = values in this interval not displayed. Basic Metabolic Panel:  Recent Labs Lab 06/20/16 0405 06/21/16 0339 06/21/16 0857 06/22/16 0824  06/23/16 0313 06/24/16 0337  NA 134* 137  --  136 137 138  K 4.4 3.1*  --  4.0 4.1 3.9  CL 104 114*  --  100* 97* 97*  CO2 20* 18*  --  30 31 33*  GLUCOSE 191* 127*  --  150* 109* 126*  BUN 16 15  --  13 11 13   CREATININE 1.19* 0.92  --  0.78 0.90 0.84  CALCIUM 9.3 6.4* 8.7 8.4* 8.8* 9.0  MG  --   --  1.6* 1.9 1.8  --   PHOS  --   --  2.5  --   --   --    GFR: Estimated Creatinine Clearance: 32.8 mL/min (by C-G formula based on SCr of 0.84 mg/dL). Liver Function Tests:  Recent Labs Lab 06/19/16 0157 06/21/16 0857  AST  --  24  ALT  --  12*  ALKPHOS  --  46  BILITOT  --  0.4  PROT  --  6.0*  ALBUMIN 3.9 2.9*   No results for input(s): LIPASE, AMYLASE in the last 168 hours. No results for input(s): AMMONIA in the last 168 hours. Coagulation Profile:  Recent Labs Lab 06/18/16 1434 06/19/16 0157  INR 0.98 1.03   Cardiac Enzymes:  Recent Labs Lab 06/18/16 2057 06/19/16 0157 06/19/16 0806 06/19/16 1345 06/19/16 2008  TROPONINI 0.03* 0.09* 0.11* 0.11* 0.10*   BNP (last 3 results) No results for input(s): PROBNP in the last 8760 hours. HbA1C: No results for input(s): HGBA1C in the last 72 hours. CBG:  Recent Labs Lab 06/23/16 1629 06/23/16 2010 06/24/16 0007 06/24/16 0411 06/24/16 0738  GLUCAP 99 136* 128* 123* 121*   Lipid Profile: No results for input(s): CHOL, HDL, LDLCALC, TRIG, CHOLHDL, LDLDIRECT in the last 72 hours. Thyroid Function Tests: No results for input(s): TSH, T4TOTAL, FREET4, T3FREE, THYROIDAB in the last 72 hours. Anemia Panel:  Recent Labs  06/21/16 0857  VITAMINB12 122*  FOLATE 2.9*  FERRITIN 191  TIBC 248*  IRON 24*  RETICCTPCT 2.3   Sepsis Labs:  Recent Labs Lab 06/18/16 1658  LATICACIDVEN 1.75    Recent Results (from the past 240 hour(s))  Urine culture     Status: Abnormal   Collection Time: 06/18/16  6:10 PM  Result Value Ref Range Status   Specimen Description URINE, RANDOM  Final   Special Requests NONE   Final   Culture >=100,000 COLONIES/mL ESCHERICHIA COLI (A)  Final   Report Status 06/21/2016 FINAL  Final   Organism ID, Bacteria ESCHERICHIA COLI (A)  Final      Susceptibility   Escherichia coli - MIC*    AMPICILLIN >=32 RESISTANT Resistant     CEFAZOLIN <=4 SENSITIVE Sensitive     CEFTRIAXONE <=1 SENSITIVE Sensitive  CIPROFLOXACIN <=0.25 SENSITIVE Sensitive     GENTAMICIN <=1 SENSITIVE Sensitive     IMIPENEM <=0.25 SENSITIVE Sensitive     NITROFURANTOIN 64 INTERMEDIATE Intermediate     TRIMETH/SULFA >=320 RESISTANT Resistant     AMPICILLIN/SULBACTAM 16 INTERMEDIATE Intermediate     PIP/TAZO <=4 SENSITIVE Sensitive     Extended ESBL NEGATIVE Sensitive     * >=100,000 COLONIES/mL ESCHERICHIA COLI  MRSA PCR Screening     Status: None   Collection Time: 06/18/16  8:48 PM  Result Value Ref Range Status   MRSA by PCR NEGATIVE NEGATIVE Final    Comment:        The GeneXpert MRSA Assay (FDA approved for NASAL specimens only), is one component of a comprehensive MRSA colonization surveillance program. It is not intended to diagnose MRSA infection nor to guide or monitor treatment for MRSA infections.          Radiology Studies: No results found.      Scheduled Meds: . ALPRAZolam  1 mg Oral QHS  . apixaban  2.5 mg Oral BID  . calcium citrate  400 mg of elemental calcium Oral QID  . cefUROXime  250 mg Oral BID WC  . cholecalciferol  1,000 Units Oral Daily  . cyanocobalamin  1,000 mcg Intramuscular Daily  . diltiazem  180 mg Oral Daily  . famotidine  10 mg Oral Daily  . folic acid  1 mg Oral Daily  . insulin aspart  0-9 Units Subcutaneous Q4H  . insulin glargine  5 Units Subcutaneous Daily  . levothyroxine  100 mcg Oral Once per day on Sun Tue Thu Fri Sat  . loratadine  10 mg Oral Daily  . senna-docusate  1 tablet Oral BID  . sodium chloride flush  10-40 mL Intracatheter Q12H  . traMADol-acetaminophen  1 tablet Oral BID   Continuous Infusions:    LOS: 6  days    Time spent: 45 mins    Ryanna Teschner, MD [facility]    If 7PM-7AM, please contact night-coverage [email or web address] Password [email or web address] 06/24/2016, 7:50 AM

## 2016-06-24 NOTE — Plan of Care (Signed)
Problem: Pain Managment: Goal: General experience of comfort will improve Outcome: Progressing Pt has slept all night, will respond to voice, but will not take medications.   Problem: Skin Integrity: Goal: Risk for impaired skin integrity will decrease Outcome: Progressing Q2 turns for patient  Problem: Fluid Volume: Goal: Ability to maintain a balanced intake and output will improve Outcome: Not Progressing Pt will only respond to voice, but not take any medications.

## 2016-06-24 NOTE — Progress Notes (Signed)
OT Cancellation Note  Patient Details Name: WILEEN SEAMAN MRN: [id number] DOB: 10-Jul-1918   Cancelled Treatment:    Reason Eval/Treat Not Completed: OT screened, no acute OT  needs identified - Pt is for SNF rehab at discharge. OT eval not needed for pre-authorization and pt will receive OT at SNF - will defer. Darlina Rumpf Lalitha Ilyas, OTR/L [phone]  06/24/2016, 10:07 AM

## 2016-06-24 NOTE — Care Management Important Message (Signed)
Important Message  Patient Details  Name: KLEA MARSLAND MRN: [id number] Date of Birth: 1919/04/09   Medicare Important Message Given:  Yes    Kerin Salen 06/24/2016, 11:49 AMImportant Message  Patient Details  Name: SHERRIANNE JACOME MRN: [id number] Date of Birth: 01-03-1919   Medicare Important Message Given:  Yes    Kerin Salen 06/24/2016, 11:49 AM

## 2016-06-24 NOTE — Progress Notes (Signed)
Pt daughter was here with pt. Pt opened eyes, we tried to give her medications to patient, pt wouldn't open her mouth for [place]. Daughter said that "it was normal for mom to act like this with an UTI." That she normally just sleeps. Will continue to get her meds in her and keep her turned every 2 hours.

## 2016-06-25 DIAGNOSIS — K5909 Other constipation: Secondary | ICD-10-CM | POA: Diagnosis not present

## 2016-06-25 DIAGNOSIS — E440 Moderate protein-calorie malnutrition: Secondary | ICD-10-CM | POA: Diagnosis not present

## 2016-06-25 DIAGNOSIS — E038 Other specified hypothyroidism: Secondary | ICD-10-CM | POA: Diagnosis not present

## 2016-06-25 DIAGNOSIS — I350 Nonrheumatic aortic (valve) stenosis: Secondary | ICD-10-CM | POA: Diagnosis not present

## 2016-06-25 DIAGNOSIS — D6489 Other specified anemias: Secondary | ICD-10-CM | POA: Diagnosis not present

## 2016-06-25 DIAGNOSIS — D518 Other vitamin B12 deficiency anemias: Secondary | ICD-10-CM

## 2016-06-25 DIAGNOSIS — I480 Paroxysmal atrial fibrillation: Secondary | ICD-10-CM | POA: Diagnosis not present

## 2016-06-25 DIAGNOSIS — E119 Type 2 diabetes mellitus without complications: Secondary | ICD-10-CM | POA: Diagnosis not present

## 2016-06-25 DIAGNOSIS — F0390 Unspecified dementia without behavioral disturbance: Secondary | ICD-10-CM | POA: Diagnosis not present

## 2016-06-25 DIAGNOSIS — S72142A Displaced intertrochanteric fracture of left femur, initial encounter for closed fracture: Secondary | ICD-10-CM | POA: Diagnosis not present

## 2016-06-25 LAB — GLUCOSE, CAPILLARY
GLUCOSE-CAPILLARY: 118 mg/dL — AB (ref 65–99)
Glucose-Capillary: 155 mg/dL — ABNORMAL HIGH (ref 65–99)

## 2016-06-25 LAB — COMPREHENSIVE METABOLIC PANEL
ALK PHOS: 48 U/L (ref 38–126)
ALT: 7 U/L — ABNORMAL LOW (ref 14–54)
ANION GAP: 7 (ref 5–15)
AST: 14 U/L — ABNORMAL LOW (ref 15–41)
Albumin: 2.4 g/dL — ABNORMAL LOW (ref 3.5–5.0)
BILIRUBIN TOTAL: 0.5 mg/dL (ref 0.3–1.2)
BUN: 12 mg/dL (ref 6–20)
CALCIUM: 9 mg/dL (ref 8.9–10.3)
CO2: 31 mmol/L (ref 22–32)
Chloride: 98 mmol/L — ABNORMAL LOW (ref 101–111)
Creatinine, Ser: 1.03 mg/dL — ABNORMAL HIGH (ref 0.44–1.00)
GFR calc non Af Amer: 45 mL/min — ABNORMAL LOW (ref >60)
GFR, EST AFRICAN AMERICAN: 52 mL/min — AB (ref >60)
Glucose, Bld: 140 mg/dL — ABNORMAL HIGH (ref 65–99)
Potassium: 3.7 mmol/L (ref 3.5–5.1)
SODIUM: 136 mmol/L (ref 135–145)
TOTAL PROTEIN: 5.3 g/dL — AB (ref 6.5–8.1)

## 2016-06-25 LAB — CBC
HCT: 31.4 % — ABNORMAL LOW (ref 36.0–46.0)
HEMOGLOBIN: 10.9 g/dL — AB (ref 12.0–15.0)
MCH: 34 pg (ref 26.0–34.0)
MCHC: 34.7 g/dL (ref 30.0–36.0)
MCV: 97.8 fL (ref 78.0–100.0)
Platelets: 271 10*3/uL (ref 150–400)
RBC: 3.21 MIL/uL — ABNORMAL LOW (ref 3.87–5.11)
RDW: 14.1 % (ref 11.5–15.5)
WBC: 8.7 10*3/uL (ref 4.0–10.5)

## 2016-06-25 MED ORDER — TRAMADOL-ACETAMINOPHEN 37.5-325 MG PO TABS: 1.0000 | ORAL_TABLET | Freq: Three times a day (TID) | ORAL | 0 refills | Status: AC | PRN

## 2016-06-25 MED ORDER — FOLIC ACID 1 MG PO TABS: 1.0000 mg | ORAL_TABLET | Freq: Every day | ORAL | 0 refills | Status: AC

## 2016-06-25 MED ORDER — DILTIAZEM HCL ER COATED BEADS 180 MG PO CP24: 180.0000 mg | ORAL_CAPSULE | Freq: Every day | ORAL | 0 refills | Status: AC

## 2016-06-25 MED ORDER — TRIMETHOPRIM 100 MG PO TABS: 100.0000 mg | ORAL_TABLET | Freq: Every day | ORAL | 0 refills | Status: AC

## 2016-06-25 MED ORDER — BISACODYL 10 MG RE SUPP: 10.0000 mg | Freq: Every day | RECTAL | 0 refills | Status: AC | PRN

## 2016-06-25 MED ORDER — METOPROLOL TARTRATE 25 MG PO TABS: 12.5000 mg | ORAL_TABLET | Freq: Two times a day (BID) | ORAL | 2 refills | Status: AC

## 2016-06-25 MED ORDER — POLYETHYLENE GLYCOL 3350 17 G PO PACK: 17.0000 g | PACK | Freq: Every day | ORAL | 0 refills | Status: AC | PRN

## 2016-06-25 MED ORDER — CEFUROXIME AXETIL 250 MG PO TABS: 250.0000 mg | ORAL_TABLET | Freq: Two times a day (BID) | ORAL | 0 refills | Status: AC

## 2016-06-25 MED ORDER — ALPRAZOLAM 1 MG PO TABS: 1.0000 mg | ORAL_TABLET | Freq: Every day | ORAL | 0 refills | Status: AC

## 2016-06-25 MED ORDER — APIXABAN 2.5 MG PO TABS: 2.5000 mg | ORAL_TABLET | Freq: Two times a day (BID) | ORAL | 0 refills | Status: AC

## 2016-06-25 NOTE — Discharge Summary (Signed)
Physician Discharge Summary  Brittany Chambers MRN: [id number] DOB/AGE: 1918-11-19 82 y.o.  PCP: Leeroy Cha, MD   Admit date: 06/18/2016 Discharge date: 06/25/2016  Discharge Diagnoses:    Principal Problem:   Closed left hip fracture [facility]) Active Problems:   UTI (urinary tract infection)   Atrial flutter with rapid ventricular response (HCC)   Dementia   Hypothyroidism   Hyperglycemia   DM (diabetes mellitus), type 2 ([place])   Fall at home   RBBB   Elevated troponin   Closed subcapital fracture of left femur (HCC)   Left displaced femoral neck fracture (HCC)   Hypocalcemia   Hypokalemia   Iron deficiency anemia   Anemia due to vitamin B12 deficiency   Anemia due to folic acid deficiency   Aortic stenosis, moderate: Per 2 D echo 06/22/2016   Atrial fibrillation with RVR ([place])    Follow-up recommendations Follow-up with PCP in 3-5 days , including all  additional recommended appointments as below Follow-up CBC, CMP, calcium in 3-5 days Follow-up with cardiology as needed, Mertie Moores, MD  Follow-up with Dr Lyla Glassing, as recommended DVT prophylaxis- apixaban for 30 days  WBAT with walker Repeat thyroid function tests in 4-6 weeks     Current Discharge Medication List    START taking these medications   Details  apixaban (ELIQUIS) 2.5 MG TABS tablet Take 1 tablet (2.5 mg total) by mouth 2 (two) times daily. Qty: 56 tablet, Refills: 0    bisacodyl (DULCOLAX) 10 MG suppository Place 1 suppository (10 mg total) rectally daily as needed for moderate constipation. Qty: 12 suppository, Refills: 0    cefUROXime (CEFTIN) 250 MG tablet Take 1 tablet (250 mg total) by mouth 2 (two) times daily with a meal. Qty: 6 tablet, Refills: 0    diltiazem (CARDIZEM CD) 180 MG 24 hr capsule Take 1 capsule (180 mg total) by mouth daily. Qty: 30 capsule, Refills: 0    folic acid (FOLVITE) 1 MG tablet Take 1 tablet (1 mg total) by mouth daily. Qty: 30 tablet, Refills: 0     metoprolol tartrate (LOPRESSOR) 25 MG tablet Take 0.5 tablets (12.5 mg total) by mouth 2 (two) times daily. Qty: 30 tablet, Refills: 2    polyethylene glycol (MIRALAX / GLYCOLAX) packet Take 17 g by mouth daily as needed for mild constipation. Qty: 14 each, Refills: 0      CONTINUE these medications which have CHANGED   Details  ALPRAZolam (XANAX) 1 MG tablet Take 1 tablet (1 mg total) by mouth at bedtime. Qty: 30 tablet, Refills: 0    traMADol-acetaminophen (ULTRACET) 37.5-325 MG tablet Take 1 tablet by mouth every 8 (eight) hours as needed. Qty: 60 tablet, Refills: 0    trimethoprim (TRIMPEX) 100 MG tablet Take 1 tablet (100 mg total) by mouth daily. continuous Qty: 30 tablet, Refills: 0      CONTINUE these medications which have NOT CHANGED   Details  levothyroxine (SYNTHROID, LEVOTHROID) 100 MCG tablet Take 100 mcg by mouth as directed. takes 5 days a weej; except on Mondays or Wednesdays    loratadine (CLARITIN) 10 MG tablet Take 10 mg by mouth daily.    ranitidine (ZANTAC) 150 MG tablet Take 150 mg by mouth 2 (two) times daily.     Vitamin D, Cholecalciferol, 1000 units CAPS Take 1,000 Units by mouth daily.       STOP taking these medications     Melatonin 3 MG TABS          Discharge Condition:  Overall prognosis guarded given age and comorbidities    Discharge Instructions Get Medicines reviewed and adjusted: Please take all your medications with you for your next visit with your Primary MD  Please request your Primary MD to go over all hospital tests and procedure/radiological results at the follow up, please ask your Primary MD to get all Hospital records sent to his/her office.  If you experience worsening of your admission symptoms, develop shortness of breath, life threatening emergency, suicidal or homicidal thoughts you must seek medical attention immediately by calling 911 or calling your MD immediately if symptoms less severe.  You must read  complete instructions/literature along with all the possible adverse reactions/side effects for all the Medicines you take and that have been prescribed to you. Take any new Medicines after you have completely understood and accpet all the possible adverse reactions/side effects.   Do not drive when taking Pain medications.   Do not take more than prescribed Pain, Sleep and Anxiety Medications  Special Instructions: If you have smoked or chewed Tobacco in the last 2 yrs please stop smoking, stop any regular Alcohol and or any Recreational drug use.  Wear Seat belts while driving.  Please note  You were cared for by a hospitalist during your hospital stay. Once you are discharged, your primary care physician will handle any further medical issues. Please note that NO REFILLS for any discharge medications will be authorized once you are discharged, as it is imperative that you return to your primary care physician (or establish a relationship with a primary care physician if you do not have one) for your aftercare needs so that they can reassess your need for medications and monitor your lab values.     No Known Allergies    Disposition: SNF   Consults:  Orthopedics Cardiology   Significant Diagnostic Studies:  Dg Tibia/fibula Left  Result Date: 06/18/2016 CLINICAL DATA:  Fall, left lower leg pain EXAM: LEFT TIBIA AND FIBULA - 2 VIEW COMPARISON:  None. FINDINGS: No fracture or dislocation is seen. Mild degenerative changes with lateral compartment chondrocalcinosis. Vascular calcifications. IMPRESSION: No acute osseus abnormality is seen. Electronically Signed   By: Julian Hy M.D.   On: 06/18/2016 16:27   Ct Head Wo Contrast  Result Date: 06/18/2016 CLINICAL DATA:  Fall, hit the left side of the head EXAM: CT HEAD WITHOUT CONTRAST CT CERVICAL SPINE WITHOUT CONTRAST TECHNIQUE: Multidetector CT imaging of the head and cervical spine was performed following the standard  protocol without intravenous contrast. Multiplanar CT image reconstructions of the cervical spine were also generated. COMPARISON:  09/04/2015 FINDINGS: CT HEAD FINDINGS Brain: No intracranial hemorrhage, mass effect or midline shift. Stable cerebral atrophy. Stable extensive periventricular and patchy subcortical chronic white matter disease. No acute cortical infarction. No mass lesion is noted on this unenhanced scan. Vascular: Atherosclerotic calcifications of carotid siphon. Skull: No skull fracture is noted. Sinuses/Orbits: No paranasal sinuses air-fluid levels. Other: There is scalp swelling and subcutaneous stranding in left lateral extra orbital region and left temporal/ zygomatic region. CT CERVICAL SPINE FINDINGS Alignment: There is normal alignment. Skull base and vertebrae: No acute fracture or subluxation. Degenerative changes are noted C1-C2 articulation. There is mild anterior and mild posterior spurring at C4-C5 and C5-C6 and C6-C7 level. Mild posterior spurring at C7-T1 level. Soft tissues and spinal canal: No prevertebral soft tissue swelling. Mild spinal canal stenosis due to posterior spurring at C4-C5 and C5-C6 level. Disc levels: Mild disc space flattening at C4-C5, C5-C6 and  C6-C7 level. Upper chest: There is no pneumothorax in visualized lung apices. Other: Atherosclerotic calcifications of vertebral arteries are noted. Extensive atherosclerotic calcifications bilateral carotid bifurcation. IMPRESSION: 1. No acute intracranial abnormality. 2. Stable atrophy and extensive chronic white matter disease. No definite acute cortical infarction. 3. There is scalp swelling and subcutaneous stranding left lateral extra orbital region and left temporal/zygomatic region. Clinical correlation is necessary. 4. No cervical spine acute fracture or subluxation. Multilevel degenerative changes as described above. Electronically Signed   By: Lahoma Crocker M.D.   On: 06/18/2016 15:22   Ct Cervical Spine Wo  Contrast  Result Date: 06/18/2016 CLINICAL DATA:  Fall, hit the left side of the head EXAM: CT HEAD WITHOUT CONTRAST CT CERVICAL SPINE WITHOUT CONTRAST TECHNIQUE: Multidetector CT imaging of the head and cervical spine was performed following the standard protocol without intravenous contrast. Multiplanar CT image reconstructions of the cervical spine were also generated. COMPARISON:  09/04/2015 FINDINGS: CT HEAD FINDINGS Brain: No intracranial hemorrhage, mass effect or midline shift. Stable cerebral atrophy. Stable extensive periventricular and patchy subcortical chronic white matter disease. No acute cortical infarction. No mass lesion is noted on this unenhanced scan. Vascular: Atherosclerotic calcifications of carotid siphon. Skull: No skull fracture is noted. Sinuses/Orbits: No paranasal sinuses air-fluid levels. Other: There is scalp swelling and subcutaneous stranding in left lateral extra orbital region and left temporal/ zygomatic region. CT CERVICAL SPINE FINDINGS Alignment: There is normal alignment. Skull base and vertebrae: No acute fracture or subluxation. Degenerative changes are noted C1-C2 articulation. There is mild anterior and mild posterior spurring at C4-C5 and C5-C6 and C6-C7 level. Mild posterior spurring at C7-T1 level. Soft tissues and spinal canal: No prevertebral soft tissue swelling. Mild spinal canal stenosis due to posterior spurring at C4-C5 and C5-C6 level. Disc levels: Mild disc space flattening at C4-C5, C5-C6 and C6-C7 level. Upper chest: There is no pneumothorax in visualized lung apices. Other: Atherosclerotic calcifications of vertebral arteries are noted. Extensive atherosclerotic calcifications bilateral carotid bifurcation. IMPRESSION: 1. No acute intracranial abnormality. 2. Stable atrophy and extensive chronic white matter disease. No definite acute cortical infarction. 3. There is scalp swelling and subcutaneous stranding left lateral extra orbital region and left  temporal/zygomatic region. Clinical correlation is necessary. 4. No cervical spine acute fracture or subluxation. Multilevel degenerative changes as described above. Electronically Signed   By: Lahoma Crocker M.D.   On: 06/18/2016 15:22   Pelvis Portable  Result Date: 06/20/2016 CLINICAL DATA:  Postoperative hemi left hip replacement. EXAM: PORTABLE PELVIS 1-2 VIEWS COMPARISON:  Abdominal CT 07/24/2015 FINDINGS: Status post left hip hemiarthroplasty with normal alignment of the prosthetic left femoral head with the acetabulum. Expected postsurgical changes. No evidence of fracture. IMPRESSION: Status post left hip hemiarthroplasty without evidence of immediate complications. Electronically Signed   By: Fidela Salisbury M.D.   On: 06/20/2016 11:20   Dg Chest Port 1 View  Result Date: 06/21/2016 CLINICAL DATA:  PICC line placement EXAM: PORTABLE CHEST 1 VIEW COMPARISON:  06/18/2016 FINDINGS: At sided PICC line tip: Lower SVC. No pneumothorax or complicating feature. There is obscuration of the right hemidiaphragm possibly from right lower lobe atelectasis. Minimal obscuration of the left hemidiaphragm medially. Atherosclerotic calcification of the aorta. Vertebral augmentation near the thoracolumbar junction. IMPRESSION: 1. Right PICC line tip:  Lower SVC.  No pneumothorax. 2. Indistinct airspace opacities at both lung bases, right greater than left, favoring atelectasis. 3. Atherosclerosis. Electronically Signed   By: Van Clines M.D.   On: 06/21/2016 17:01  Dg Chest Portable 1 View  Result Date: 06/18/2016 CLINICAL DATA:  Hip fracture.  Dementia EXAM: PORTABLE CHEST 1 VIEW COMPARISON:  03/11/2015 FINDINGS: Chronic borderline cardiomegaly, accentuated by technique and rotation. Diffuse atherosclerotic calcification of the aorta. There is no edema, consolidation, effusion, or pneumothorax. Osteopenia. No acute osseous finding. IMPRESSION: No evidence of active disease. Electronically Signed   By:  Monte Fantasia M.D.   On: 06/18/2016 16:44   Dg C-arm 1-60 Min-no Report  Result Date: 06/20/2016 There is no Radiologist interpretation  for this exam.  Dg Hip Operative Unilat W Or W/o Pelvis Left  Result Date: 06/20/2016 CLINICAL DATA:  Left hip arthroplasty post femoral neck fracture. EXAM: OPERATIVE left HIP (WITH PELVIS IF PERFORMED) 3 VIEWS TECHNIQUE: Fluoroscopic spot image(s) were submitted for interpretation post-operatively. COMPARISON:  06/18/2016 and 06/20/2016 FINDINGS: There has been placement of a left hip arthroplasty intact and normally located. Recommend correlation with findings at the time of the procedure. IMPRESSION: Left hip arthroplasty intact and normally located. Electronically Signed   By: Marin Olp M.D.   On: 06/20/2016 11:34   Dg Hip Unilat With Pelvis 2-3 Views Left  Result Date: 06/18/2016 CLINICAL DATA:  Fall, left hip pain EXAM: DG HIP (WITH OR WITHOUT PELVIS) 2-3V LEFT COMPARISON:  None. FINDINGS: Subcapital left hip fracture. Visualized bony pelvis appears intact. Bilobed hip joint spaces are preserved. IMPRESSION: Subcapital left hip fracture. Electronically Signed   By: Julian Hy M.D.   On: 06/18/2016 16:09        Filed Weights   06/18/16 2200  Weight: 58 kg (127 lb 13.9 oz)     Microbiology: Recent Results (from the past 240 hour(s))  Urine culture     Status: Abnormal   Collection Time: 06/18/16  6:10 PM  Result Value Ref Range Status   Specimen Description URINE, RANDOM  Final   Special Requests NONE  Final   Culture >=100,000 COLONIES/mL ESCHERICHIA COLI (A)  Final   Report Status 06/21/2016 FINAL  Final   Organism ID, Bacteria ESCHERICHIA COLI (A)  Final      Susceptibility   Escherichia coli - MIC*    AMPICILLIN >=32 RESISTANT Resistant     CEFAZOLIN <=4 SENSITIVE Sensitive     CEFTRIAXONE <=1 SENSITIVE Sensitive     CIPROFLOXACIN <=0.25 SENSITIVE Sensitive     GENTAMICIN <=1 SENSITIVE Sensitive     IMIPENEM <=0.25  SENSITIVE Sensitive     NITROFURANTOIN 64 INTERMEDIATE Intermediate     TRIMETH/SULFA >=320 RESISTANT Resistant     AMPICILLIN/SULBACTAM 16 INTERMEDIATE Intermediate     PIP/TAZO <=4 SENSITIVE Sensitive     Extended ESBL NEGATIVE Sensitive     * >=100,000 COLONIES/mL ESCHERICHIA COLI  MRSA PCR Screening     Status: None   Collection Time: 06/18/16  8:48 PM  Result Value Ref Range Status   MRSA by PCR NEGATIVE NEGATIVE Final    Comment:        The GeneXpert MRSA Assay (FDA approved for NASAL specimens only), is one component of a comprehensive MRSA colonization surveillance program. It is not intended to diagnose MRSA infection nor to guide or monitor treatment for MRSA infections.        Blood Culture    Component Value Date/Time   SDES URINE, RANDOM 06/18/2016 1810   SPECREQUEST NONE 06/18/2016 1810   CULT >=100,000 COLONIES/mL ESCHERICHIA COLI (A) 06/18/2016 1810   REPTSTATUS 06/21/2016 FINAL 06/18/2016 1810      Labs: Results for orders placed or performed  during the hospital encounter of 06/18/16 (from the past 48 hour(s))  Glucose, capillary     Status: Abnormal   Collection Time: 06/23/16 11:26 AM  Result Value Ref Range   Glucose-Capillary 133 (H) 65 - 99 mg/dL  Glucose, capillary     Status: None   Collection Time: 06/23/16  4:29 PM  Result Value Ref Range   Glucose-Capillary 99 65 - 99 mg/dL  Glucose, capillary     Status: Abnormal   Collection Time: 06/23/16  8:10 PM  Result Value Ref Range   Glucose-Capillary 136 (H) 65 - 99 mg/dL  Glucose, capillary     Status: Abnormal   Collection Time: 06/24/16 12:07 AM  Result Value Ref Range   Glucose-Capillary 128 (H) 65 - 99 mg/dL  Basic metabolic panel     Status: Abnormal   Collection Time: 06/24/16  3:37 AM  Result Value Ref Range   Sodium 138 135 - 145 mmol/L   Potassium 3.9 3.5 - 5.1 mmol/L   Chloride 97 (L) 101 - 111 mmol/L   CO2 33 (H) 22 - 32 mmol/L   Glucose, Bld 126 (H) 65 - 99 mg/dL   BUN 13  6 - 20 mg/dL   Creatinine, Ser 0.84 0.44 - 1.00 mg/dL   Calcium 9.0 8.9 - 10.3 mg/dL   GFR calc non Af Amer 57 (L) >60 mL/min   GFR calc Af Amer >60 >60 mL/min    Comment: (NOTE) The eGFR has been calculated using the CKD EPI equation. This calculation has not been validated in all clinical situations. eGFR's persistently <60 mL/min signify possible Chronic Kidney Disease.    Anion gap 8 5 - 15  CBC with Differential/Platelet     Status: Abnormal   Collection Time: 06/24/16  3:37 AM  Result Value Ref Range   WBC 10.6 (H) 4.0 - 10.5 K/uL   RBC 3.29 (L) 3.87 - 5.11 MIL/uL   Hemoglobin 11.3 (L) 12.0 - 15.0 g/dL   HCT 32.4 (L) 36.0 - 46.0 %   MCV 98.5 78.0 - 100.0 fL   MCH 34.3 (H) 26.0 - 34.0 pg   MCHC 34.9 30.0 - 36.0 g/dL   RDW 14.1 11.5 - 15.5 %   Platelets 235 150 - 400 K/uL   Neutrophils Relative % 75 %   Neutro Abs 8.0 (H) 1.7 - 7.7 K/uL   Lymphocytes Relative 12 %   Lymphs Abs 1.3 0.7 - 4.0 K/uL   Monocytes Relative 10 %   Monocytes Absolute 1.0 0.1 - 1.0 K/uL   Eosinophils Relative 3 %   Eosinophils Absolute 0.3 0.0 - 0.7 K/uL   Basophils Relative 0 %   Basophils Absolute 0.0 0.0 - 0.1 K/uL  Glucose, capillary     Status: Abnormal   Collection Time: 06/24/16  4:11 AM  Result Value Ref Range   Glucose-Capillary 123 (H) 65 - 99 mg/dL  Glucose, capillary     Status: Abnormal   Collection Time: 06/24/16  7:38 AM  Result Value Ref Range   Glucose-Capillary 121 (H) 65 - 99 mg/dL  Glucose, capillary     Status: Abnormal   Collection Time: 06/24/16 11:45 AM  Result Value Ref Range   Glucose-Capillary 143 (H) 65 - 99 mg/dL  Glucose, capillary     Status: Abnormal   Collection Time: 06/24/16  5:06 PM  Result Value Ref Range   Glucose-Capillary 126 (H) 65 - 99 mg/dL  Glucose, capillary     Status: Abnormal   Collection  Time: 06/24/16  9:47 PM  Result Value Ref Range   Glucose-Capillary 151 (H) 65 - 99 mg/dL  CBC     Status: Abnormal   Collection Time: 06/25/16  4:05  AM  Result Value Ref Range   WBC 8.7 4.0 - 10.5 K/uL   RBC 3.21 (L) 3.87 - 5.11 MIL/uL   Hemoglobin 10.9 (L) 12.0 - 15.0 g/dL   HCT 31.4 (L) 36.0 - 46.0 %   MCV 97.8 78.0 - 100.0 fL   MCH 34.0 26.0 - 34.0 pg   MCHC 34.7 30.0 - 36.0 g/dL   RDW 14.1 11.5 - 15.5 %   Platelets 271 150 - 400 K/uL  Comprehensive metabolic panel     Status: Abnormal   Collection Time: 06/25/16  4:05 AM  Result Value Ref Range   Sodium 136 135 - 145 mmol/L   Potassium 3.7 3.5 - 5.1 mmol/L   Chloride 98 (L) 101 - 111 mmol/L   CO2 31 22 - 32 mmol/L   Glucose, Bld 140 (H) 65 - 99 mg/dL   BUN 12 6 - 20 mg/dL   Creatinine, Ser 1.03 (H) 0.44 - 1.00 mg/dL   Calcium 9.0 8.9 - 10.3 mg/dL   Total Protein 5.3 (L) 6.5 - 8.1 g/dL   Albumin 2.4 (L) 3.5 - 5.0 g/dL   AST 14 (L) 15 - 41 U/L   ALT 7 (L) 14 - 54 U/L   Alkaline Phosphatase 48 38 - 126 U/L   Total Bilirubin 0.5 0.3 - 1.2 mg/dL   GFR calc non Af Amer 45 (L) >60 mL/min   GFR calc Af Amer 52 (L) >60 mL/min    Comment: (NOTE) The eGFR has been calculated using the CKD EPI equation. This calculation has not been validated in all clinical situations. eGFR's persistently <60 mL/min signify possible Chronic Kidney Disease.    Anion gap 7 5 - 15  Glucose, capillary     Status: Abnormal   Collection Time: 06/25/16  7:51 AM  Result Value Ref Range   Glucose-Capillary 118 (H) 65 - 99 mg/dL     Lipid Panel  No results found for: CHOL, TRIG, HDL, CHOLHDL, VLDL, LDLCALC, LDLDIRECT   Lab Results  Component Value Date   HGBA1C 6.5 (H) 06/18/2016     Lab Results  Component Value Date   CREATININE 1.03 (H) 06/25/2016     HPI :  Patient is a 82 year old lady history of dementia, recurrent cataracts, gastroesophageal reflux disease, hypothyroidism, failure to thrive who presented to the ED after a fall while using the bathroom. Patient noted to have a left hip fracture. Patient also noted to be significantly hypotensive and in a flutter with RVR. First  set of troponins mildly elevated. Patient placed on a Cardizem drip in place in the step down unit. Cardiology consulted for preop clearance and management of A. fib and elevated troponins. Patient s/p hip repair 06/20/2016.  HOSPITAL COURSE:   #1 closed left hip fracture Likely secondary to mechanical fall patient sustained while going to the bathroom. Patient has been seen in consultation by orthopedics and patient status post left hip hemiarthroplasty 06/20/2016 per Dr. Lyla Glassing. 2-D echo EF 60-65%.   Per cardiology feels patient has good LV function based on normal blood pressure, heart rate and good pulses and feel not necessary to obtain echo prior to surgery. Patient was seen by cardiology preoperatively. Per orthopedics, started eliquis for DVT prophylaxis x 1 month.   follow up with orthopedics as recommended   #  2 atrial flutter with RVR CHA2DS2VASC = 4 Patient noted to be in a flutter with RVR on admission and placed on a Cardizem drip  . Cardiology recommended addition of low-dose beta blocker which was started last evening with Coreg 3.125 mg twice a day. Coreg was subsequently discontinued secondary to borderline blood pressure with systolic blood pressures in the 90s. Subsequently patient started on metoprolol, to which she responded well. Now being discharged on Cardizem CD and metoprolol. Patient not candidate for long-term anticoagulation per cardiology given high risk for falls .Marland Kitchen Patient been transitioned off Cardizem drip to oral Cardizem  increase dose to 180 mg/24 hours.     #3 dementia Stable. Will need SNF  #4 Escherichia coli urinary tract infection Patient afebrile. WBC trending down. Transitioned from IV Rocephin to oral Ceftin to complete course of antibiotic treatment 3 more days. Once antibiotic treatment has been completed will resume patient's prophylactic antibiotics.   #5 diabetes mellitus type 2 Blood glucose in the 200s on admission. CBGs currently ranging  from 105-111.    #6 elevated troponin May be secondary to demand ischemia secondary to fall versus NSTEMI. Cardiac enzymes mildly elevated to 0.11 which seems to have plateaued. Patient denied any chest pain. 2-D echo with EF of 60-65%, no wall motion abnormalities, moderate aortic stenosis.  Patient was started on Coreg, however due to systolic blood pressures in the 80s and 90s Coreg was discontinued.   subsequently switched to metoprolol which she tolerated well  #7 moderate aortic stenosis Per 2-D echo. Cardiology has discussed with family who at this time is not interested in any invasive measures/procedures. No further evaluation needed per cardiology.  #8 hypothyroidism TSH at 5.188. Patient was noted to be taking Synthroid daily except on 2 days during the week. Continue Synthroid on a daily basis and will need repeat thyroid function studies done in about 4-6 weeks.  #9 right bundle branch block  #10 Fall Questionable etiology. Mechanical versus secondary to UTI versus secondary to elevated troponins. Urine cultures pending. Patient on empiric IV antibiotics. Cardiac enzymes elevated up to 0.11 which seems to have plateaued.  2-D echo 60-65%, no wall motion abnormality, moderate aortic stenosis. Cardiology following. PT/OT. Recommended SNF  #11 hypokalemia Magnesium level was 1.6. Repleted.   #12 iron deficiency anemia/acute blood loss anemia/folic acid OYDXAJOINO/M-76 deficiency Patient with no overt bleeding. Hemoglobin currently at 11.3 from   14.3 on admission. Likely postop acute blood loss anemia. Anemia panel consistent with iron deficiency anemia, folate deficiency, B-12 deficiency. Continue vitamin B-12 1000 MCG's IM daily, folic acid 1 mg by mouth daily, s/p IV iron for pharmacy. Transfusion threshold hemoglobin less than 7.  #13 hypocalcemia Questionable etiology. Likely secondary to vitamin D deficiency. Vitamin D levels on the low end of normal concern for  vitamin D deficiency. PTH, intact calcium pending. Magnesium level was low at 1.6 and phosphorus on borderline low at 2.5. Received IV calcium gluconate 1. Hypocalcemia improving on supplementation. Continue calcium citrate 400 mg 4 times daily and vitamin D 1000 international units needs daily.      Discharge Exam:   Blood pressure 134/81, pulse 86, temperature 97.7 F (36.5 C), temperature source Oral, resp. rate 18, height '5\' 1"'  (1.549 m), weight 58 kg (127 lb 13.9 oz), SpO2 90 %.  General exam: In bed.Sleeping. Respiratory system: Clear to auscultation anterior lung fields.Marland Kitchen Respiratory effort normal. Cardiovascular system: Irregularly irregular. No JVD, murmurs, rubs, gallops or clicks. No pedal edema. Gastrointestinal system: Abdomen is nondistended,  soft and nontender. No organomegaly or masses felt. Normal bowel sounds heard. Central nervous system: Sleeping. No focal neurological deficits. Extremities: No c/c/e Skin: No rashes, lesions or ulcers Psychiatry: Judgement and insight poor-fair. Mood & affect appropriate.      Contact information for follow-up providers    [place], Horald Pollen, MD. Schedule an appointment as soon as possible for a visit in 2 week(s).   Specialty:  Orthopedic Surgery Why:  For wound re-check Contact information: [address] [address] [phone]            Contact information for after-discharge care    Destination    [facility] SN[facility] Follow up.   Specialty:  [place] information: [place] [phone]                  Signed: Reyne Dumas 06/25/2016, 8:40 AM        Time spent >45 mins

## 2016-06-25 NOTE — Progress Notes (Signed)
Physical Therapy Treatment Patient Details Name: Brittany VANDRUNEN MRN: [id number] DOB: Jan 06, 1919 Today's Date: 06/25/2016    History of Present Illness Patient is a 82 year old lady history of dementia,  hypothyroidism, failure to thrive who presented to the ED after a fall while using the bathroom. Patient noted to have a left hip fracture. Patient also noted to be significantly hypotensive and in a flutter with RVR. First set of troponins mildly elevated. Patient placed on a Cardizem drip in place in the step down unit. Cardiology consulted for preop clearance and management of Afib and elevated troponins. Patient s/p DA L hip hemi 06/20/2016.    PT Comments    Pt assisted to sitting EOB, requires external support of trunk due to posterior lean.  Pt also assisted to standing position x2 however fatigues very quickly.  Pt to d/c to SNF today.  Follow Up Recommendations  SNF;Supervision/Assistance - 24 hour     Equipment Recommendations  None recommended by PT    Recommendations for Other Services       Precautions / Restrictions Precautions Precautions: Fall Restrictions LLE Weight Bearing: Weight bearing as tolerated    Mobility  Bed Mobility Overal bed mobility: Needs Assistance Bed Mobility: Supine to Sit;Sit to Supine     Supine to sit: +2 for physical assistance;+2 for safety/equipment;HOB elevated;Total assist Sit to supine: +2 for physical assistance;+2 for safety/equipment;Max assist   General bed mobility comments: more assist for getting to EOB however pt more lethargic, pt attempting to assist upon return to bed  Transfers Overall transfer level: Needs assistance Equipment used: Rolling walker (2 wheeled) Transfers: Sit to/from Stand Sit to Stand: Max assist;Mod assist;From elevated surface;+2 safety/equipment;+2 physical assistance         General transfer comment: multimodal cues for safe technique, performed twice and able to assist more with second  transfer, unable to hold standing position (<15 sec)  Ambulation/Gait                 Stairs            Wheelchair Mobility    Modified Rankin (Stroke Patients Only)       Balance Overall balance assessment: History of Falls;Needs assistance Sitting-balance support: Feet supported;Bilateral upper extremity supported Sitting balance-Leahy Scale: Zero Sitting balance - Comments: pt requiring increased external assist Postural control: Posterior lean                          Cognition Arousal/Alertness: Lethargic Behavior During Therapy: WFL for tasks assessed/performed Overall Cognitive Status: Impaired/Different from baseline Area of Impairment: Orientation;Attention;Following commands;Awareness       Following Commands: Follows one step commands with increased time       General Comments: pt very lethargic on arrival, RN states possibly due to meds, more alert/awake upon sitting EOB and attempted to assist with mobility    Exercises      General Comments        Pertinent Vitals/Pain Pain Assessment: Faces Faces Pain Scale: Hurts even more Pain Location: L hip Pain Descriptors / Indicators: Grimacing;Guarding;Moaning Pain Intervention(s): Limited activity within patient's tolerance;Monitored during session;Repositioned    Home Living                      Prior Function            PT Goals (current goals can now be found in the care plan section) Progress towards PT goals: Progressing toward goals  Frequency    Min 3X/week      PT Plan Current plan remains appropriate    Co-evaluation             End of Session Equipment Utilized During Treatment: Gait belt Activity Tolerance: Patient limited by pain;Patient limited by fatigue Patient left: in bed;with call bell/phone within reach;with bed alarm set;with family/visitor present     Time: 1330-1346 PT Time Calculation (min) (ACUTE ONLY): 16  min  Charges:  $Therapeutic Activity: 8-22 mins                    G Codes:      Latice Waitman,KATHrine E 07-18-16, 3:17 PM Carmelia Bake, PT, DPT 07/18/2016 Pager: [phone]

## 2016-06-25 NOTE — Progress Notes (Signed)
   Subjective:  Patient reports pain as mild to moderate.    Objective:   VITALS:   Vitals:   06/24/16 0413 06/24/16 1449 06/24/16 2148 06/25/16 0512  BP: 117/76 (!) 112/96 (!) 148/62 134/81  Pulse: 88 (!) 51 89 86  Resp: 18 17 18 18   Temp: 98 F (36.7 C) 98.7 F (37.1 C) 98 F (36.7 C) 97.7 F (36.5 C)  TempSrc: Axillary Oral Oral Oral  SpO2: 93% 93% 96% 90%  Weight:      Height:       NAD, sleepy ABD soft Sensation intact distally Intact pulses distally Dorsiflexion/Plantar flexion intact Incision: dressing C/D/I Compartment soft  Lab Results  Component Value Date   WBC 8.7 06/25/2016   HGB 10.9 (L) 06/25/2016   HCT 31.4 (L) 06/25/2016   MCV 97.8 06/25/2016   PLT 271 06/25/2016   BMET    Component Value Date/Time   NA 136 06/25/2016 0405   K 3.7 06/25/2016 0405   CL 98 (L) 06/25/2016 0405   CO2 31 06/25/2016 0405   GLUCOSE 140 (H) 06/25/2016 0405   BUN 12 06/25/2016 0405   CREATININE 1.03 (H) 06/25/2016 0405   CALCIUM 9.0 06/25/2016 0405   CALCIUM 8.7 06/21/2016 0857   GFRNONAA 45 (L) 06/25/2016 0405   GFRAA 52 (L) 06/25/2016 0405     Assessment/Plan: 5 Days Post-Op   Principal Problem:   Closed left hip fracture (HCC) Active Problems:   UTI (urinary tract infection)   Atrial flutter with rapid ventricular response ([place])   Dementia   Hypothyroidism   Hyperglycemia   DM (diabetes mellitus), type 2 ([place])   Fall at home   RBBB   Elevated troponin   Closed subcapital fracture of left femur (HCC)   Left displaced femoral neck fracture (HCC)   Hypocalcemia   Hypokalemia   Iron deficiency anemia   Anemia due to vitamin B12 deficiency   Anemia due to folic acid deficiency   Aortic stenosis, moderate: Per 2 D echo 06/22/2016   Atrial fibrillation with RVR ([place])   WBAT with walker DVT ppx: apixaban for 30 days - long term anticoag not recommended by cardiology due to risk of falls, SCDs, TEDs PT/OT D/C planning, SNF placement   [place],  Horald Pollen 06/25/2016, 3:37 PM   Rod Can, MD Cell [phone]

## 2016-06-25 NOTE — Progress Notes (Signed)
[employer] (silverback) authorization obtained (#: [id number]), Narda Rutherford at [facility] aware. [facility] here for transport.    Raynaldo Opitz, [facility] Clinical Social Worker cell #: [phone]

## 2016-06-25 NOTE — Progress Notes (Signed)
Report given to [facility] at [facility], pt prepared for [employer] via EMS. D/C instructions reviewed with pt daughter and acknowledge understanding. SRP, RN

## 2016-06-25 NOTE — Clinical Social Work Placement (Signed)
Patient is set to discharge to [facility] today once [facility] authorization is obtained, awaiting call back. Patient & daughter, Brittany Chambers at bedside made aware. Discharge packet given to RN, Sophia. [facility] called for transport to pickup at 3:30pm.     Raynaldo Opitz, [place] Worker cell #: [phone]    CLINICAL SOCIAL WORK PLACEMENT  NOTE  Date:  06/25/2016  Patient Details  Name: Brittany Chambers MRN: [id number] Date of Birth: September 20, 1918  Clinical Social Work is seeking post-discharge placement for this patient at the [place] level of care (*CSW will initial, date and re-position this form in  chart as items are completed):  Yes   Patient/family provided with [place] Work Department's list of facilities offering this level of care within the geographic area requested by the patient (or if unable, by the patient's family).  Yes   Patient/family informed of their freedom to choose among providers that offer the needed level of care, that participate in Medicare, Medicaid or managed care program needed by the patient, have an available bed and are willing to accept the patient.  Yes   Patient/family informed of [facility]'s ownership interest in [facility] and [facility], as well as of the fact that they are under no obligation to receive care at these facilities.  PASRR submitted to EDS on 06/25/16     PASRR number received on 06/25/16     Existing PASRR number confirmed on       FL2 transmitted to all facilities in geographic area requested by pt/family on 06/25/16     FL2 transmitted to all facilities within larger geographic area on       Patient informed that his/her managed care company has contracts with or will negotiate with certain facilities, including the following:        Yes   Patient/family informed of bed offers received.  Patient chooses bed at [facility]     Physician recommends and patient chooses bed at      Patient to be transferred to [facility] on 06/25/16.  Patient to be transferred to facility by [facility]     Patient family notified on 06/25/16 of transfer.  Name of family member notified:  patient's daughter, Brittany Chambers at bedside     PHYSICIAN       Additional Comment:    _______________________________________________ Standley Brooking, LCSW 06/25/2016, 11:33 AM

## 2016-06-29 DIAGNOSIS — R262 Difficulty in walking, not elsewhere classified: Secondary | ICD-10-CM | POA: Diagnosis not present

## 2016-06-29 DIAGNOSIS — I4891 Unspecified atrial fibrillation: Secondary | ICD-10-CM | POA: Diagnosis not present

## 2016-06-29 DIAGNOSIS — R1311 Dysphagia, oral phase: Secondary | ICD-10-CM | POA: Diagnosis not present

## 2016-06-29 DIAGNOSIS — S72142D Displaced intertrochanteric fracture of left femur, subsequent encounter for closed fracture with routine healing: Secondary | ICD-10-CM | POA: Diagnosis not present

## 2016-06-29 DIAGNOSIS — E039 Hypothyroidism, unspecified: Secondary | ICD-10-CM | POA: Diagnosis not present

## 2016-06-29 DIAGNOSIS — I350 Nonrheumatic aortic (valve) stenosis: Secondary | ICD-10-CM | POA: Diagnosis not present

## 2016-06-29 DIAGNOSIS — I480 Paroxysmal atrial fibrillation: Secondary | ICD-10-CM | POA: Diagnosis not present

## 2016-06-29 DIAGNOSIS — E038 Other specified hypothyroidism: Secondary | ICD-10-CM | POA: Diagnosis not present

## 2016-06-29 DIAGNOSIS — D509 Iron deficiency anemia, unspecified: Secondary | ICD-10-CM | POA: Diagnosis not present

## 2016-06-29 DIAGNOSIS — E119 Type 2 diabetes mellitus without complications: Secondary | ICD-10-CM | POA: Diagnosis not present

## 2016-06-29 DIAGNOSIS — F0390 Unspecified dementia without behavioral disturbance: Secondary | ICD-10-CM | POA: Diagnosis not present

## 2016-06-29 DIAGNOSIS — M25559 Pain in unspecified hip: Secondary | ICD-10-CM | POA: Diagnosis not present

## 2016-06-29 DIAGNOSIS — N390 Urinary tract infection, site not specified: Secondary | ICD-10-CM | POA: Diagnosis not present

## 2016-06-29 DIAGNOSIS — M6281 Muscle weakness (generalized): Secondary | ICD-10-CM | POA: Diagnosis not present

## 2016-06-29 DIAGNOSIS — I4892 Unspecified atrial flutter: Secondary | ICD-10-CM | POA: Diagnosis not present

## 2016-06-29 DIAGNOSIS — R627 Adult failure to thrive: Secondary | ICD-10-CM | POA: Diagnosis not present

## 2016-06-29 DIAGNOSIS — K5909 Other constipation: Secondary | ICD-10-CM | POA: Diagnosis not present

## 2016-06-29 DIAGNOSIS — R531 Weakness: Secondary | ICD-10-CM | POA: Diagnosis not present

## 2016-06-29 DIAGNOSIS — S72092D Other fracture of head and neck of left femur, subsequent encounter for closed fracture with routine healing: Secondary | ICD-10-CM | POA: Diagnosis not present

## 2016-06-29 DIAGNOSIS — W19XXXD Unspecified fall, subsequent encounter: Secondary | ICD-10-CM | POA: Diagnosis not present

## 2016-06-29 DIAGNOSIS — E440 Moderate protein-calorie malnutrition: Secondary | ICD-10-CM | POA: Diagnosis not present

## 2016-06-29 DIAGNOSIS — D6489 Other specified anemias: Secondary | ICD-10-CM | POA: Diagnosis not present

## 2016-06-29 DIAGNOSIS — S72002D Fracture of unspecified part of neck of left femur, subsequent encounter for closed fracture with routine healing: Secondary | ICD-10-CM | POA: Diagnosis not present

## 2016-06-30 DIAGNOSIS — I4892 Unspecified atrial flutter: Secondary | ICD-10-CM | POA: Diagnosis not present

## 2016-06-30 DIAGNOSIS — W19XXXD Unspecified fall, subsequent encounter: Secondary | ICD-10-CM | POA: Diagnosis not present

## 2016-06-30 DIAGNOSIS — S72002D Fracture of unspecified part of neck of left femur, subsequent encounter for closed fracture with routine healing: Secondary | ICD-10-CM | POA: Diagnosis not present

## 2016-06-30 DIAGNOSIS — N390 Urinary tract infection, site not specified: Secondary | ICD-10-CM | POA: Diagnosis not present

## 2016-07-08 DIAGNOSIS — S72092D Other fracture of head and neck of left femur, subsequent encounter for closed fracture with routine healing: Secondary | ICD-10-CM | POA: Diagnosis not present

## 2016-07-09 DIAGNOSIS — E119 Type 2 diabetes mellitus without complications: Secondary | ICD-10-CM | POA: Diagnosis not present

## 2016-07-09 DIAGNOSIS — E038 Other specified hypothyroidism: Secondary | ICD-10-CM | POA: Diagnosis not present

## 2016-07-09 DIAGNOSIS — S72142D Displaced intertrochanteric fracture of left femur, subsequent encounter for closed fracture with routine healing: Secondary | ICD-10-CM | POA: Diagnosis not present

## 2016-07-09 DIAGNOSIS — F0390 Unspecified dementia without behavioral disturbance: Secondary | ICD-10-CM | POA: Diagnosis not present

## 2016-07-09 DIAGNOSIS — K5909 Other constipation: Secondary | ICD-10-CM | POA: Diagnosis not present

## 2016-07-09 DIAGNOSIS — I480 Paroxysmal atrial fibrillation: Secondary | ICD-10-CM | POA: Diagnosis not present

## 2016-07-09 DIAGNOSIS — I350 Nonrheumatic aortic (valve) stenosis: Secondary | ICD-10-CM | POA: Diagnosis not present

## 2016-07-09 DIAGNOSIS — D6489 Other specified anemias: Secondary | ICD-10-CM | POA: Diagnosis not present

## 2016-07-09 DIAGNOSIS — R627 Adult failure to thrive: Secondary | ICD-10-CM | POA: Diagnosis not present

## 2016-07-09 DIAGNOSIS — E440 Moderate protein-calorie malnutrition: Secondary | ICD-10-CM | POA: Diagnosis not present

## 2016-07-16 DIAGNOSIS — E039 Hypothyroidism, unspecified: Secondary | ICD-10-CM | POA: Diagnosis not present

## 2016-07-16 DIAGNOSIS — S72002D Fracture of unspecified part of neck of left femur, subsequent encounter for closed fracture with routine healing: Secondary | ICD-10-CM | POA: Diagnosis not present

## 2016-07-16 DIAGNOSIS — E119 Type 2 diabetes mellitus without complications: Secondary | ICD-10-CM | POA: Diagnosis not present

## 2016-07-16 DIAGNOSIS — I4892 Unspecified atrial flutter: Secondary | ICD-10-CM | POA: Diagnosis not present

## 2016-10-27 DEATH — deceased

## 2018-01-21 IMAGING — CT CT ABD-PELV W/ CM
2 of 5 series · 10 of 46 positions shown, 11 images · IV contrast (Iodine)
Comparison: None.

CLINICAL DATA: Lower abdominal pain. Recurrent urinary tract
infections.

EXAM:
CT ABDOMEN AND PELVIS WITH CONTRAST
TECHNIQUE: Multidetector CT imaging of the abdomen and pelvis was performed
using the standard protocol following bolus administration of
intravenous contrast.
CONTRAST:  75mL OMNIPAQUE IOHEXOL 300 MG/ML  SOLN

[Series 201: routine, idose (2) · axial · 0.87mm/px · z∈[-353,-23]mm · 7 of 88 slices shown, 8 images]
[im 11/88  soft-tissue]
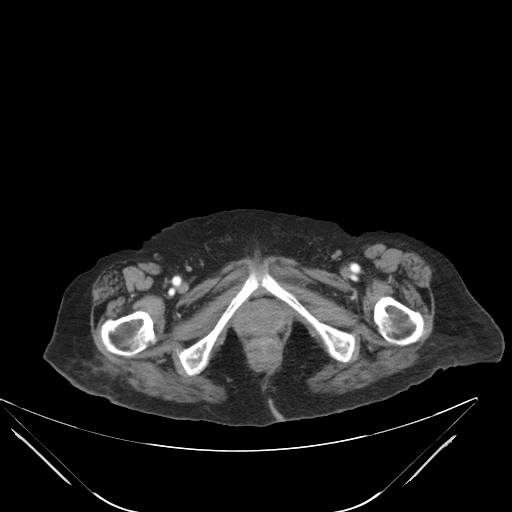
[im 11/88  bone]
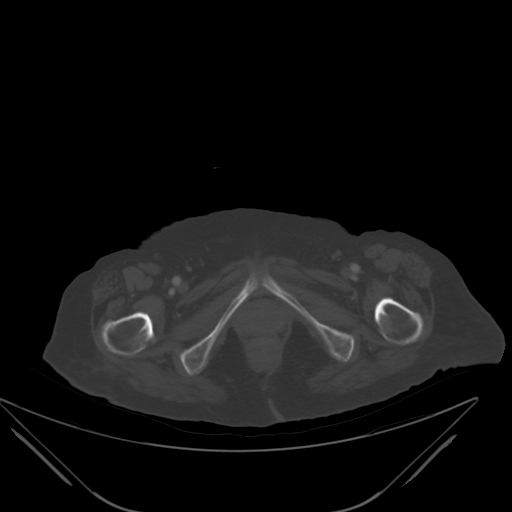
[im 22/88  soft-tissue]
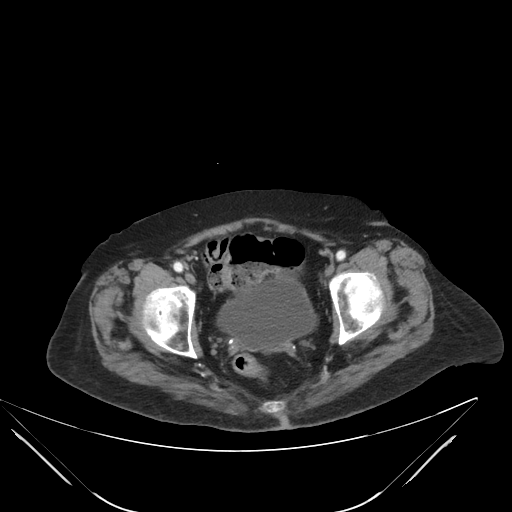
[im 33/88  soft-tissue]
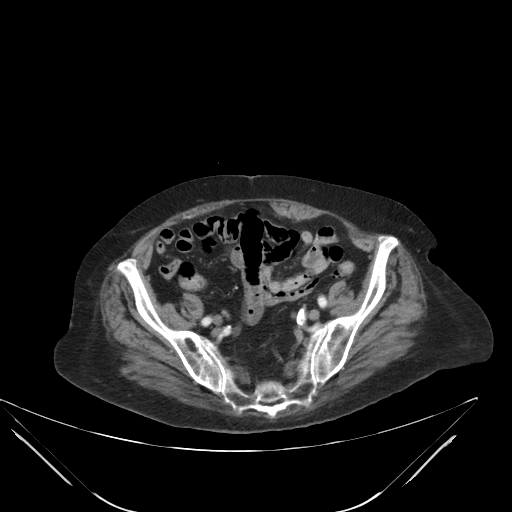
[im 44/88  soft-tissue]
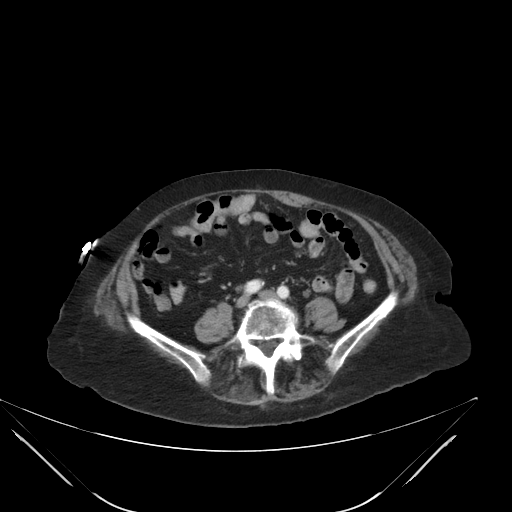
[im 55/88  soft-tissue]
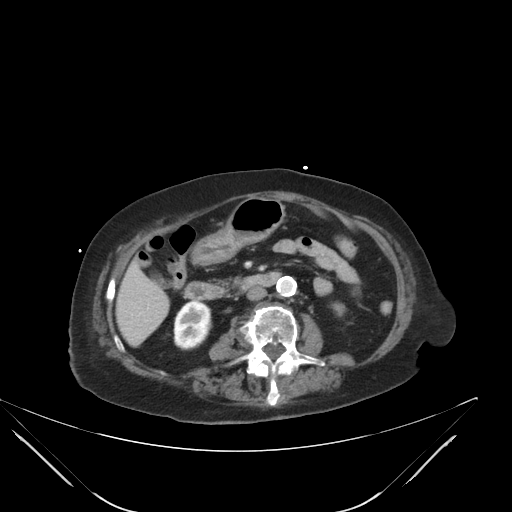
[im 66/88  soft-tissue]
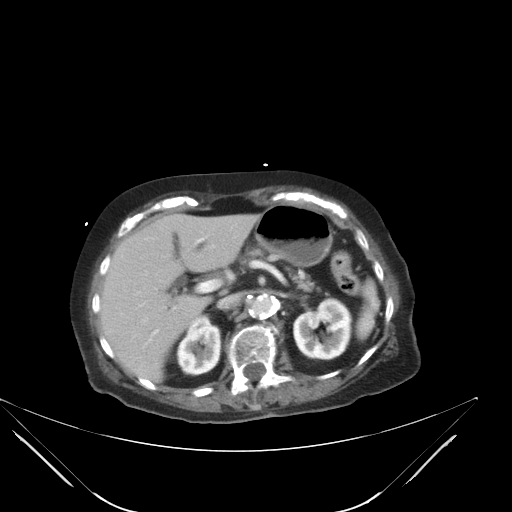
[im 77/88  soft-tissue]
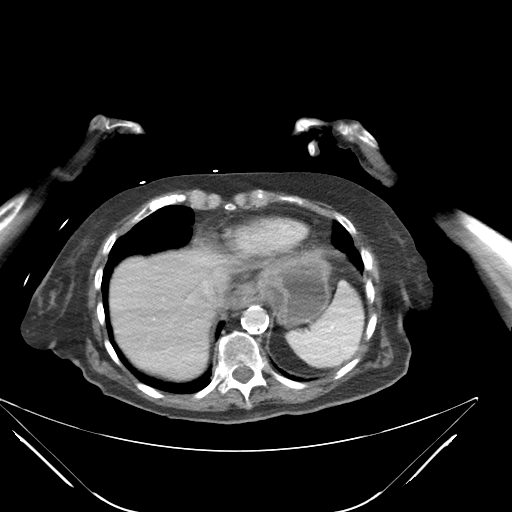

[Series 203: coronals, idose (2) · coronal · 0.45mm/px · 3 of 97 slices shown]
[im 33/97  soft-tissue]
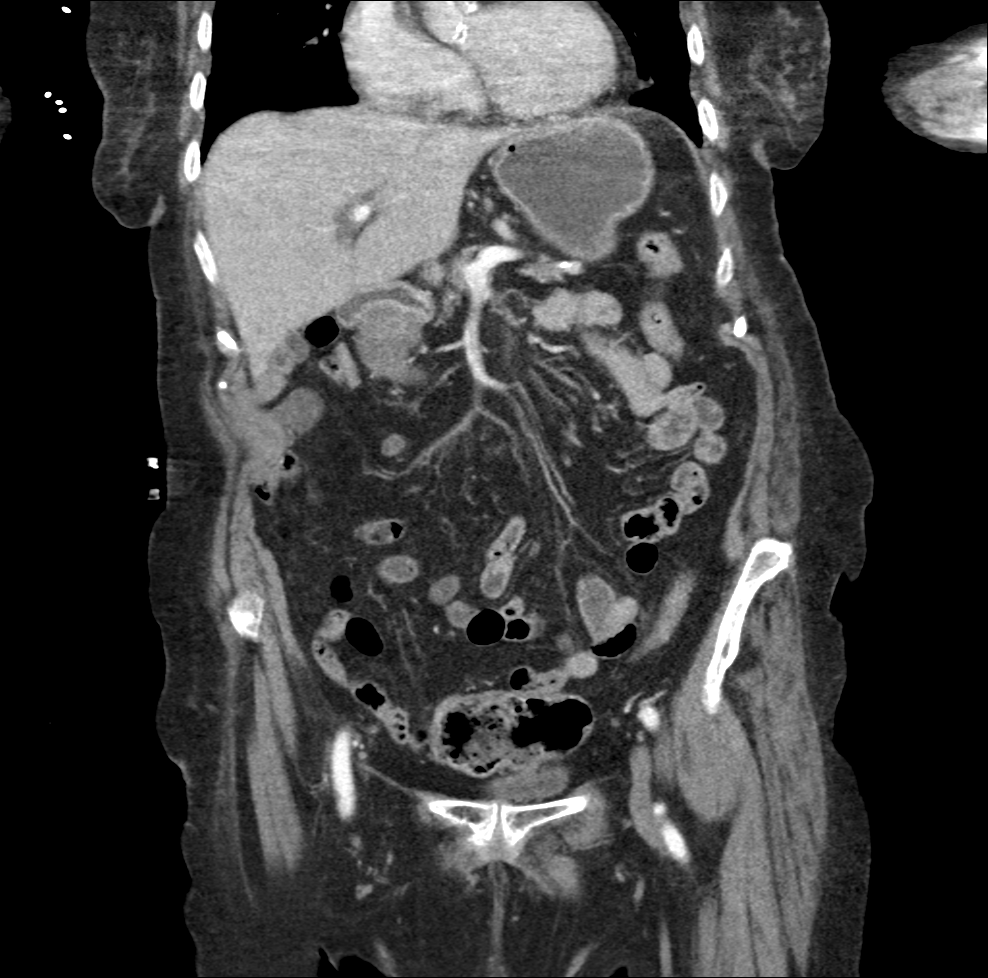
[im 43/97  soft-tissue]
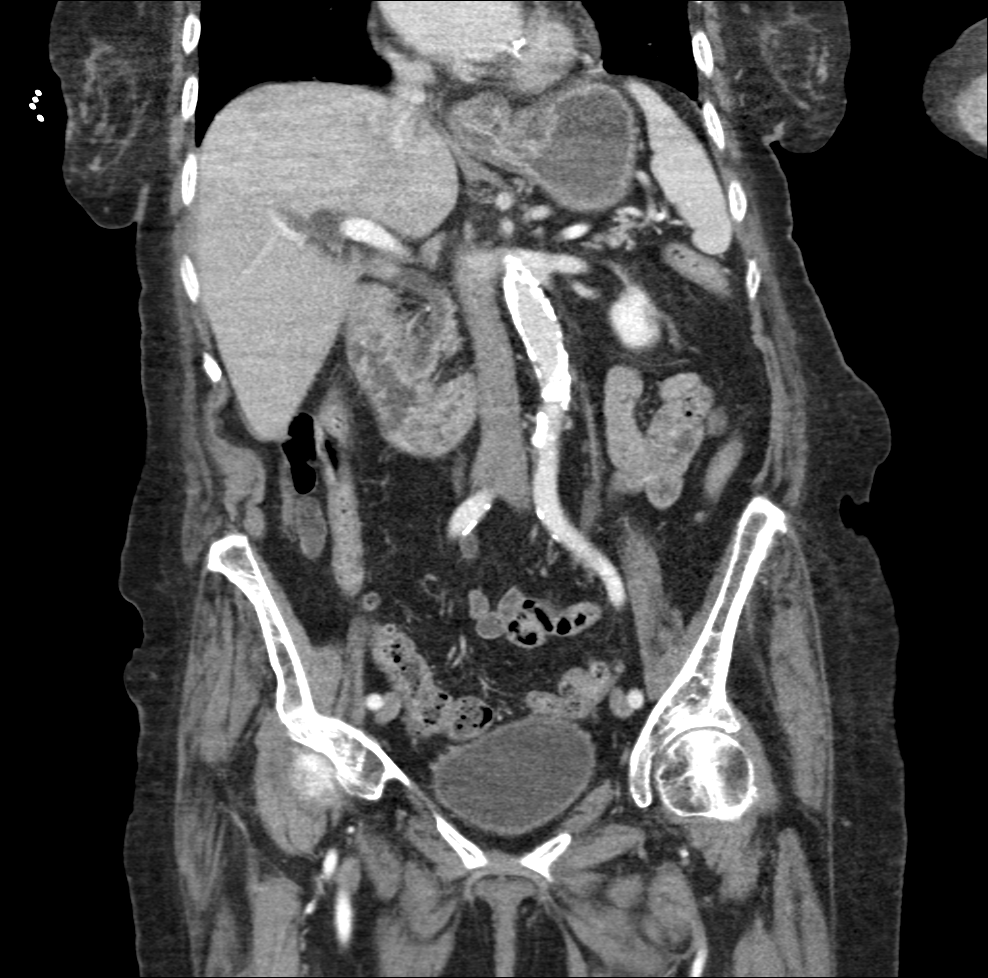
[im 54/97  soft-tissue]
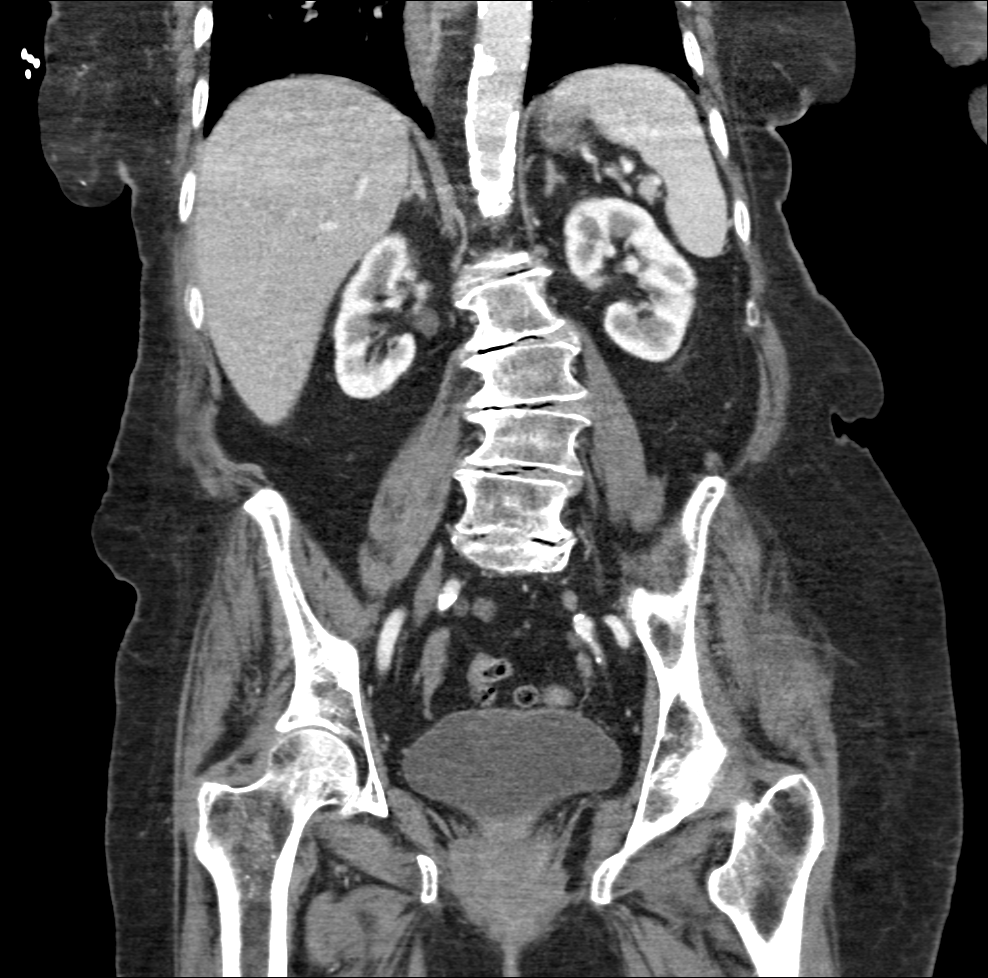

[10 of 46 positions shown; findings below may reference images not displayed]

FINDINGS: Lower chest: Small hiatal hernia is seen. There is also mild wall
thickening of the distal thoracic esophagus, suspicious for
esophagitis, although esophageal carcinoma cannot definitely be
excluded.

Hepatobiliary: No liver masses are identified. Mild diffuse biliary
ductal dilatation is seen which may be related to prior
cholecystectomy. No obstructing etiology visualized by CT.

Pancreas: No mass, inflammatory changes, or other significant
abnormality.

Spleen: Within normal limits in size and appearance.

Adrenals/Urinary Tract: No masses identified. Small right renal cyst
noted. No evidence of hydronephrosis.

Stomach/Bowel: No evidence of obstruction, inflammatory process, or
abnormal fluid collections. Diverticulosis of the sigmoid colon is
demonstrated, however there is no evidence of diverticulitis.

Vascular/Lymphatic: No pathologically enlarged lymph nodes. No
evidence of abdominal aortic aneurysm.

Reproductive: Prior hysterectomy noted. Adnexal regions are
unremarkable in appearance.

Other: None.

Musculoskeletal: No suspicious bone lesions identified. Advanced
lumbar spine degenerative changes seen. Old T12 vertebral body
compression fracture with previous vertebroplasty noted.
IMPRESSION: Small hiatal hernia. Distal esophageal wall thickening is suspicious
for esophagitis although esophageal carcinoma cannot definitely be
excluded. Consider upper endoscopy for further evaluation.

Diffuse biliary ductal dilatation, likely secondary to prior
cholecystectomy. Recommend correlation with liver function tests,
and consider MRCP for further evaluation if clinically warranted.

Colonic diverticulosis. No radiographic evidence of diverticulitis.
